# Patient Record
Sex: Female | Born: 1990 | Race: White | Hispanic: No | Marital: Married | Smoking: Never smoker
Health system: Southern US, Community
[De-identification: ages and names within clinical notes are randomized; demographics above are authoritative.]

## PROBLEM LIST (undated history)

## (undated) DIAGNOSIS — Z9889 Other specified postprocedural states: Secondary | ICD-10-CM

## (undated) DIAGNOSIS — R112 Nausea with vomiting, unspecified: Secondary | ICD-10-CM

## (undated) DIAGNOSIS — Z8719 Personal history of other diseases of the digestive system: Secondary | ICD-10-CM

---

## 2017-07-23 DIAGNOSIS — E538 Deficiency of other specified B group vitamins: Secondary | ICD-10-CM | POA: Insufficient documentation

## 2018-11-05 ENCOUNTER — Encounter: Payer: Self-pay | Admitting: *Deleted

## 2018-11-05 DIAGNOSIS — Z3490 Encounter for supervision of normal pregnancy, unspecified, unspecified trimester: Secondary | ICD-10-CM | POA: Insufficient documentation

## 2018-11-13 ENCOUNTER — Other Ambulatory Visit: Payer: Self-pay

## 2018-11-13 ENCOUNTER — Encounter: Payer: Self-pay | Admitting: Advanced Practice Midwife

## 2018-11-13 ENCOUNTER — Ambulatory Visit (INDEPENDENT_AMBULATORY_CARE_PROVIDER_SITE_OTHER): Payer: BC Managed Care – PPO | Admitting: Advanced Practice Midwife

## 2018-11-13 VITALS — BP 110/69 | HR 74 | Ht 62.0 in | Wt 162.0 lb

## 2018-11-13 DIAGNOSIS — O34219 Maternal care for unspecified type scar from previous cesarean delivery: Secondary | ICD-10-CM | POA: Diagnosis not present

## 2018-11-13 DIAGNOSIS — Z8759 Personal history of other complications of pregnancy, childbirth and the puerperium: Secondary | ICD-10-CM | POA: Insufficient documentation

## 2018-11-13 DIAGNOSIS — Z3A10 10 weeks gestation of pregnancy: Secondary | ICD-10-CM

## 2018-11-13 DIAGNOSIS — Z3490 Encounter for supervision of normal pregnancy, unspecified, unspecified trimester: Secondary | ICD-10-CM

## 2018-11-13 MED ORDER — ASPIRIN EC 81 MG PO TBEC: 81.0000 mg | DELAYED_RELEASE_TABLET | Freq: Every day | ORAL | 8 refills | Status: DC

## 2018-11-13 NOTE — Progress Notes (Signed)
Bedside U/S shows single IUP with FHT of 178 BPM and CRL measures 34.52mm  GA is [redacted]w[redacted]d

## 2018-11-13 NOTE — Progress Notes (Signed)
Gestational hypertension in last pregnancy

## 2018-11-13 NOTE — Progress Notes (Signed)
Subjective:   Catherine Wu is a 28 y.o. [id number] at 6110w1d by LMP c/w 10 week ultrasound being seen today for her first obstetrical visit.  Her obstetrical history is significant for pregnancy induced hypertension, previous cesarean section. Patient does intend to breast feed. Pregnancy history fully reviewed.  Patient reports heartburn and occasional nausea that is improving. Denies vomiting, abdominal cramping, vaginal bleeding or abnormal discharge. Pt would like to discuss TOLAC.   HISTORY: OB History  Gravida Para Term Preterm AB Living  5 2 2  0 2 2  SAB TAB Ectopic Multiple Live Births  2 0 0 0 2    # Outcome Date GA Lbr Len/2nd Weight Sex Delivery Anes PTL Lv  5 Current           4 SAB           3 SAB           2 Term      CS-LTranv     1 Term      CS-LTranv      No past medical history on file. Past Surgical History:  Procedure Laterality Date  . CESAREAN SECTION     Family History  Problem Relation Age of Onset  . Kidney disease Maternal Grandfather        dialysis  . Heart disease Maternal Grandfather   . Thyroid disease Paternal Grandmother    Social History   Tobacco Use  . Smoking status: Never Smoker  . Smokeless tobacco: Never Used  Substance Use Topics  . Alcohol use: Never    Frequency: Never  . Drug use: Never   Allergies  Allergen Reactions  . Penicillins    No current outpatient medications on file prior to visit.   No current facility-administered medications on file prior to visit.      Indications for ASA therapy (per uptodate) One of the following: Previous pregnancy with preeclampsia, especially early onset and with an adverse outcome Yes Multifetal gestation No Chronic hypertension No Type 1 or 2 diabetes mellitus No Chronic kidney disease No Autoimmune disease (antiphospholipid syndrome, systemic lupus erythematosus) No  Two or more of the following: Nulliparity No Obesity (body mass index >30 kg/m2) No Family history of  preeclampsia in mother or sister No Age ?35 years No Sociodemographic characteristics (African American race, low socioeconomic level) No Personal risk factors (eg, previous pregnancy with low birth weight or small for gestational age infant, previous adverse pregnancy outcome [eg, stillbirth], interval >10 years between pregnancies) No   Indications for early 1 hour GTT (per uptodate)  BMI >25 (>23 in Asian women) AND one of the following  Gestational diabetes mellitus in a previous pregnancy No Glycated hemoglobin ?5.7 percent (39 mmol/mol), impaired glucose tolerance, or impaired fasting glucose on previous testing No First-degree relative with diabetes No High-risk race/ethnicity (eg, African American, Latino, Native American, [place] American, Pacific Islander) No History of cardiovascular disease No Hypertension or on therapy for hypertension No High-density lipoprotein cholesterol level <35 mg/dL (1.470.90 mmol/L) and/or a triglyceride level >250 mg/dL (8.292.82 mmol/L) No Polycystic ovary syndrome No Physical inactivity No Other clinical condition associated with insulin resistance (eg, severe obesity, acanthosis nigricans) No Previous birth of an infant weighing ?4000 g No Previous stillbirth of unknown cause No   Exam   Vitals:   11/13/18 1521 11/13/18 1523  BP: 110/69   Pulse: 74   Weight: 73.5 kg   Height:  5\' 2"  (1.575 m)   Fetal Heart  Rate (bpm): 178  Uterus:     Pelvic Exam: Perineum: Deferred   Vulva: Deferred   Vagina:  Deferred   Cervix: Deferred; pap smear not obtained    Adnexa: Deferred   Bony Pelvis: average  System: General: well-developed, well-nourished female in no acute distress   Breast:  normal appearance, no masses or tenderness   Skin: normal coloration and turgor, no rashes   Neurologic: oriented, normal, negative, normal mood   Extremities: normal strength, tone, and muscle mass, ROM of all joints is normal   HEENT PERRLA, extraocular movement  intact and sclera clear, anicteric   Mouth/Teeth mucous membranes moist, pharynx normal without lesions and dental hygiene good   Neck supple and no masses   Cardiovascular: regular rate and rhythm   Respiratory:  no respiratory distress, normal breath sounds   Abdomen: soft, non-tender; bowel sounds normal; no masses,  no organomegaly     Assessment:   Pregnancy: [id number] Patient Active Problem List   Diagnosis Date Noted  . Supervision of normal pregnancy, antepartum 11/05/2018     Plan:  1. Encounter for supervision of normal pregnancy, antepartum, unspecified gravidity - Pt would like information on TOLAC/VBAC. Discussed with patient and the need to talk with MD for consent. Information given to patient.  - Anticipatory guidance provided for upcoming appointments  - Obstetric panel - HIV antibody (with reflex) - Culture, OB Urine - Babyscripts Schedule Optimization - [place] bedside; Future - Comprehensive metabolic panel - Protein / creatinine ratio, urine   2. History of gestational hypertension - Pt with history of pre-e and gHTN - Start ASA at 12 weeks  - aspirin EC 81 MG tablet; Take 1 tablet (81 mg total) by mouth daily.  Dispense: 30 tablet; Refill: 8   Initial labs drawn. Start on ASA at [redacted] weeks gestation Continue prenatal vitamins. Genetic Screening discussed, NIPS: undecided. Would like to contact insurance company and discuss cost  Problem list reviewed and updated The nature of [place] for [employer] with multiple MDs and other [place] Providers was explained to patient; also emphasized that fellows, residents, and students are part of our team. Routine obstetric precautions reviewed No follow-ups on file.  Follow up in 3 weeks for ROB and NIPS or as needed   Renee Harder, SNM 5:05 PM 11/13/18    CNM attestation:  I have seen and examined this patient; I agree with above documentation in the midwife student's note.    Catherine Wu is a 28 y.o. [id number] in the [place] office for routine prenatal visit for low risk pregnancy. See problem list below. Denies LOF, VB, contractions, vaginal discharge.  Patient Active Problem List   Diagnosis Date Noted  . Supervision of normal pregnancy, antepartum 11/05/2018     ROS, labs, PMH reviewed  PE: BP 110/69   Pulse 74   Ht 5\' 2"  (1.575 m)   Wt 162 lb (73.5 kg)   LMP 09/03/2018   BMI 29.63 kg/m  Gen: calm comfortable, well appearing Resp: normal effort, no distress Abd: gravid appropriate for gestational age  Bedside US reveals IUP with normal FHR with dates d/w LMP dating  Assessment/Plan:  1. Encounter for supervision of normal pregnancy, antepartum, unspecified gravidity --Anticipatory guidance about next visits/weeks of pregnancy given. --Pt considering NIPS, will check with insurance about coverage - Obstetric panel - HIV antibody (with reflex) - Culture, OB Urine - Babyscripts Schedule Optimization - [place] bedside; Future - Comprehensive metabolic panel - Protein /  creatinine ratio, urine - Cytology - PAP( [place])  2. History of gestational hypertension --PEC with first pregnancy, some mildly elevated BP that resolved with hospital admission last pregnancy.  Pt thinks she was anxious because of the first pregnancy causing her BP to be high. - aspirin EC 81 MG tablet; Take 1 tablet (81 mg total) by mouth daily.  Dispense: 30 tablet; Refill: 8   3. Hx of cesarean section complicating pregnancy --Interest in TOLAC, does not think she was given a chance with [place] in [place].   --Discussed increased risks with 2 prior C/S, need for labor to begin on its own, no IOL.  MD to consent pt but printed materials given today for she and her husband to review.  Sharen CounterLisa Leftwich-Kirby, CNM 5:39 PM

## 2018-11-14 LAB — COMPREHENSIVE METABOLIC PANEL
AG Ratio: 1.6 (calc) (ref 1.0–2.5)
ALT: 8 U/L (ref 6–29)
AST: 12 U/L (ref 10–30)
Albumin: 4.2 g/dL (ref 3.6–5.1)
Alkaline phosphatase (APISO): 53 U/L (ref 31–125)
BUN: 7 mg/dL (ref 7–25)
CO2: 25 mmol/L (ref 20–32)
Calcium: 10.2 mg/dL (ref 8.6–10.2)
Chloride: 103 mmol/L (ref 98–110)
Creat: 0.62 mg/dL (ref 0.50–1.10)
Globulin: 2.7 g/dL (calc) (ref 1.9–3.7)
Glucose, Bld: 88 mg/dL (ref 65–99)
Potassium: 4 mmol/L (ref 3.5–5.3)
Sodium: 139 mmol/L (ref 135–146)
Total Bilirubin: 0.6 mg/dL (ref 0.2–1.2)
Total Protein: 6.9 g/dL (ref 6.1–8.1)

## 2018-11-14 LAB — OBSTETRIC PANEL
Absolute Monocytes: 476 cells/uL (ref 200–950)
Antibody Screen: NOT DETECTED
Basophils Absolute: 9 cells/uL (ref 0–200)
Basophils Relative: 0.1 %
Eosinophils Absolute: 43 cells/uL (ref 15–500)
Eosinophils Relative: 0.5 %
HCT: 37.4 % (ref 35.0–45.0)
Hemoglobin: 12.5 g/dL (ref 11.7–15.5)
Hepatitis B Surface Ag: NONREACTIVE
Lymphs Abs: 2227 cells/uL (ref 850–3900)
MCH: 30.9 pg (ref 27.0–33.0)
MCHC: 33.4 g/dL (ref 32.0–36.0)
MCV: 92.3 fL (ref 80.0–100.0)
MPV: 11.5 fL (ref 7.5–12.5)
Monocytes Relative: 5.6 %
Neutro Abs: 5746 cells/uL (ref 1500–7800)
Neutrophils Relative %: 67.6 %
Platelets: 242 10*3/uL (ref 140–400)
RBC: 4.05 10*6/uL (ref 3.80–5.10)
RDW: 13.3 % (ref 11.0–15.0)
RPR Ser Ql: NONREACTIVE
Rubella: 1.23 index
Total Lymphocyte: 26.2 %
WBC: 8.5 10*3/uL (ref 3.8–10.8)

## 2018-11-14 LAB — PROTEIN / CREATININE RATIO, URINE
Creatinine, Urine: 17 mg/dL — ABNORMAL LOW (ref 20–275)
Total Protein, Urine: <4 mg/dL — ABNORMAL LOW (ref 5–24)

## 2018-11-14 LAB — HIV ANTIBODY (ROUTINE TESTING W REFLEX): HIV 1&2 Ab, 4th Generation: NONREACTIVE

## 2018-11-15 LAB — CULTURE, OB URINE

## 2018-11-15 LAB — URINE CULTURE, OB REFLEX

## 2018-12-04 ENCOUNTER — Ambulatory Visit (INDEPENDENT_AMBULATORY_CARE_PROVIDER_SITE_OTHER): Payer: BC Managed Care – PPO | Admitting: Advanced Practice Midwife

## 2018-12-04 ENCOUNTER — Other Ambulatory Visit: Payer: Self-pay

## 2018-12-04 VITALS — BP 119/78 | HR 89 | Wt 162.0 lb

## 2018-12-04 DIAGNOSIS — O09292 Supervision of pregnancy with other poor reproductive or obstetric history, second trimester: Secondary | ICD-10-CM

## 2018-12-04 DIAGNOSIS — Z3480 Encounter for supervision of other normal pregnancy, unspecified trimester: Secondary | ICD-10-CM

## 2018-12-04 DIAGNOSIS — Z3A19 19 weeks gestation of pregnancy: Secondary | ICD-10-CM

## 2018-12-04 DIAGNOSIS — Z8759 Personal history of other complications of pregnancy, childbirth and the puerperium: Secondary | ICD-10-CM

## 2018-12-04 NOTE — Progress Notes (Signed)
   PRENATAL VISIT NOTE  Subjective:  Catherine Wu is a 28 y.o. [id number] at [redacted]w[redacted]d being seen today for ongoing prenatal care.  She is currently monitored for the following issues for this low-risk pregnancy and has Supervision of normal pregnancy, antepartum; Hx of cesarean section complicating pregnancy; and History of gestational hypertension on their problem list.  Patient reports no complaints.  Contractions: Not present. Vag. Bleeding: None.  Movement: Absent. Denies leaking of fluid.   The following portions of the patient's history were reviewed and updated as appropriate: allergies, current medications, past family history, past medical history, past social history, past surgical history and problem list.   Objective:   Vitals:   12/04/18 1341  BP: 119/78  Pulse: 89  Weight: 73.5 kg    Fetal Status: Fetal Heart Rate (bpm): 162   Movement: Absent     General:  Alert, oriented and cooperative. Patient is in no acute distress.  Skin: Skin is warm and dry. No rash noted.   Cardiovascular: Normal heart rate noted  Respiratory: Normal respiratory effort, no problems with respiration noted  Abdomen: Soft, gravid, appropriate for gestational age.  Pain/Pressure: Absent     Pelvic: Cervical exam deferred        Extremities: Normal range of motion.  Edema: None  Mental Status: Normal mood and affect. Normal behavior. Normal judgment and thought content.   Assessment and Plan:  Pregnancy: [id number] at [redacted]w[redacted]d  1. Supervision of other normal pregnancy, antepartum - Pt is doing well. No complaints today - Pt interested in NIPS. Needs approval from insurance company. Pt contacted insurance company while here in the office and was told that everything should be sent to [place] in about 11 days. - Once approved, pt to come in for lab visit - Pt interested in [place]. Was given paper on risks/benefits of TOLAC. Will need MD consent during pregnancy - Anticipatory guidance for upcoming  appointments  - Genetic Screening  2. History of gestational hypertension - Pt started on BASA at 7 weeks - Educated on use of BASA until 6 week PP with those at risk for pre-eclampsia   3. [redacted] weeks gestation of pregnancy - Will schedule anatomy scan today  - [place] MFM OB COMP + 14 WK; Future   Preterm labor symptoms and general obstetric precautions including but not limited to vaginal bleeding, contractions, leaking of fluid and fetal movement were reviewed in detail with the patient. Please refer to After Visit Summary for other counseling recommendations.   No follow-ups on file.  No future appointments.  Follow up in 6 weeks for ROB  Maryagnes Amos, SNM

## 2018-12-04 NOTE — Patient Instructions (Signed)

## 2018-12-14 ENCOUNTER — Encounter: Payer: Self-pay | Admitting: *Deleted

## 2018-12-14 DIAGNOSIS — Z3480 Encounter for supervision of other normal pregnancy, unspecified trimester: Secondary | ICD-10-CM

## 2019-01-16 ENCOUNTER — Ambulatory Visit (HOSPITAL_COMMUNITY): Payer: BC Managed Care – PPO

## 2019-01-22 ENCOUNTER — Ambulatory Visit (HOSPITAL_COMMUNITY)
Admission: RE | Admit: 2019-01-22 | Discharge: 2019-01-22 | Disposition: A | Discharge status: Home / Self Care | Payer: BC Managed Care – PPO | Source: Ambulatory Visit | Attending: Advanced Practice Midwife | Admitting: Advanced Practice Midwife

## 2019-01-22 ENCOUNTER — Other Ambulatory Visit: Payer: Self-pay

## 2019-01-22 ENCOUNTER — Other Ambulatory Visit: Payer: Self-pay | Admitting: Advanced Practice Midwife

## 2019-01-22 ENCOUNTER — Telehealth: Payer: BC Managed Care – PPO | Admitting: Obstetrics and Gynecology

## 2019-01-22 DIAGNOSIS — Z3A19 19 weeks gestation of pregnancy: Secondary | ICD-10-CM

## 2019-01-22 DIAGNOSIS — O09299 Supervision of pregnancy with other poor reproductive or obstetric history, unspecified trimester: Secondary | ICD-10-CM

## 2019-01-22 DIAGNOSIS — O34219 Maternal care for unspecified type scar from previous cesarean delivery: Secondary | ICD-10-CM

## 2019-01-22 DIAGNOSIS — Z3A20 20 weeks gestation of pregnancy: Secondary | ICD-10-CM | POA: Diagnosis not present

## 2019-01-25 ENCOUNTER — Telehealth (INDEPENDENT_AMBULATORY_CARE_PROVIDER_SITE_OTHER): Payer: BC Managed Care – PPO | Admitting: Obstetrics and Gynecology

## 2019-01-25 DIAGNOSIS — Z3482 Encounter for supervision of other normal pregnancy, second trimester: Secondary | ICD-10-CM

## 2019-01-25 DIAGNOSIS — Z3A20 20 weeks gestation of pregnancy: Secondary | ICD-10-CM | POA: Diagnosis not present

## 2019-01-25 DIAGNOSIS — Z3480 Encounter for supervision of other normal pregnancy, unspecified trimester: Secondary | ICD-10-CM

## 2019-01-25 NOTE — Progress Notes (Signed)
Pt states she will go buy BP cuff and start entering BP into Babyscripts

## 2019-01-25 NOTE — Progress Notes (Signed)
   TELEHEALTH VIRTUAL OBSTETRICS VISIT ENCOUNTER NOTE  I connected with Catherine Wu on 01/25/19 at 10:15 AM EDT by telephone at home and verified that I am speaking with the correct person using two identifiers.   I discussed the limitations, risks, security and privacy concerns of performing an evaluation and management service by telephone and the availability of in person appointments. I also discussed with the patient that there may be a patient responsible charge related to this service. The patient expressed understanding and agreed to proceed.  Subjective:  Catherine Wu is a 28 y.o. [phone] at [redacted]w[redacted]d being followed for ongoing prenatal care.  She is currently monitored for the following issues for this low-risk pregnancy and has Supervision of normal pregnancy, antepartum; Hx of cesarean section complicating pregnancy; and History of gestational hypertension on their problem list.  Patient reports no complaints. Reports fetal movement. Denies any contractions, bleeding or leaking of fluid.   The following portions of the patient's history were reviewed and updated as appropriate: allergies, current medications, past family history, past medical history, past social history, past surgical history and problem list.   Objective:   General:  Alert, oriented and cooperative.   Mental Status: Normal mood and affect perceived. Normal judgment and thought content.  Rest of physical exam deferred due to type of encounter  Assessment and Plan:  Pregnancy: [id number] at [redacted]w[redacted]d 1. Supervision of other normal pregnancy, antepartum  Does not have BP cuff. Plans to get one today. Discussed importance of checking BP. Continue BASA. Next visit with MD to discuss risk of TOLAC after 2 cesarean sections.   Preterm labor symptoms and general obstetric precautions including but not limited to vaginal bleeding, contractions, leaking of fluid and fetal movement were reviewed in detail with the patient.  I  discussed the assessment and treatment plan with the patient. The patient was provided an opportunity to ask questions and all were answered. The patient agreed with the plan and demonstrated an understanding of the instructions. The patient was advised to call back or seek an in-person office evaluation/go to [facility] at [facility] for any urgent or concerning symptoms. Please refer to After Visit Summary for other counseling recommendations.   I provided 11 minutes of non-face-to-face time during this encounter.  No follow-ups on file.  No future appointments.  Noni Saupe, NP [facility] [employer] [facility], [place]

## 2019-03-27 ENCOUNTER — Telehealth: Payer: Self-pay | Admitting: *Deleted

## 2019-03-27 NOTE — Telephone Encounter (Signed)
Left patient a message to call and schedule OB/GTT appointment for Friday, 04/05/2019 at 9:00 AM as of this time today.

## 2019-04-05 ENCOUNTER — Other Ambulatory Visit: Payer: Self-pay

## 2019-04-05 ENCOUNTER — Ambulatory Visit (INDEPENDENT_AMBULATORY_CARE_PROVIDER_SITE_OTHER): Payer: BC Managed Care – PPO | Admitting: Certified Nurse Midwife

## 2019-04-05 VITALS — BP 128/76 | HR 105 | Temp 98.4°F | Wt 186.0 lb

## 2019-04-05 DIAGNOSIS — Z3480 Encounter for supervision of other normal pregnancy, unspecified trimester: Secondary | ICD-10-CM

## 2019-04-05 DIAGNOSIS — Z3A28 28 weeks gestation of pregnancy: Secondary | ICD-10-CM

## 2019-04-05 DIAGNOSIS — Z8759 Personal history of other complications of pregnancy, childbirth and the puerperium: Secondary | ICD-10-CM

## 2019-04-05 DIAGNOSIS — O34219 Maternal care for unspecified type scar from previous cesarean delivery: Secondary | ICD-10-CM

## 2019-04-05 NOTE — Progress Notes (Signed)
Subjective:  Catherine Wu is a 29 y.o. [phone] at [redacted]w[redacted]d being seen today for ongoing prenatal care.  She is currently monitored for the following issues for this low-risk pregnancy and has Supervision of normal pregnancy, antepartum; Hx of cesarean section complicating pregnancy; and History of gestational hypertension on their problem list.  Patient reports no complaints.  Contractions: Not present. Vag. Bleeding: None.  Movement: Present. Denies leaking of fluid.   The following portions of the patient's history were reviewed and updated as appropriate: allergies, current medications, past family history, past medical history, past social history, past surgical history and problem list. Problem list updated.  Objective:   Vitals:   04/05/19 0840  BP: 128/76  Pulse: (!) 105  Temp: 98.4 F (36.9 C)  Weight: 186 lb (84.4 kg)    Fetal Status: Fetal Heart Rate (bpm): 148 Fundal Height: 32 cm Movement: Present  Presentation: Vertex  General:  Alert, oriented and cooperative. Patient is in no acute distress.  Skin: Skin is warm and dry. No rash noted.   Cardiovascular: Normal heart rate noted  Respiratory: Normal respiratory effort, no problems with respiration noted  Abdomen: Soft, gravid, appropriate for gestational age. Pain/Pressure: Absent     Pelvic: Vag. Bleeding: None Vag D/C Character: Thin   Cervical exam deferred        Extremities: Normal range of motion.  Edema: None  Mental Status: Normal mood and affect. Normal behavior. Normal judgment and thought content.   Urinalysis:      Assessment and Plan:  Pregnancy: [id number] at [redacted]w[redacted]d  1. Supervision of other normal pregnancy, antepartum - 2 Hour GTT - HIV antibody (with reflex) - CBC - RPR - had lapse in care from 20-30 wks, was going to change practices d/t insurance but decided to stay with [facility]  2. Hx of cesarean section complicating pregnancy - desires TOLAC after 2 prev CS - hx of failed IOL for PEC then RCS for gHTN  (delivered in [place]) - will need MD consult visit- schedule next - informed may allow spontaneous labor but no IOL or augmentation  3. History of gestational hypertension - continue bASA - home BP monitoring  Preterm labor symptoms and general obstetric precautions including but not limited to vaginal bleeding, contractions, leaking of fluid and fetal movement were reviewed in detail with the patient. Please refer to After Visit Summary for other counseling recommendations.  Return in about 2 weeks (around 04/19/2019).   Donette Larry, CNM

## 2019-04-05 NOTE — Patient Instructions (Signed)
Third Trimester of Pregnancy The third trimester is from week 28 through week 40 (months 7 through 9). The third trimester is a time when the unborn baby (fetus) is growing rapidly. At the end of the ninth month, the fetus is about 20 inches in length and weighs 6-10 pounds. Body changes during your third trimester Your body will continue to go through many changes during pregnancy. The changes vary from woman to woman. During the third trimester:  Your weight will continue to increase. You can expect to gain 25-35 pounds (11-16 kg) by the end of the pregnancy.  You may begin to get stretch marks on your hips, abdomen, and breasts.  You may urinate more often because the fetus is moving lower into your pelvis and pressing on your bladder.  You may develop or continue to have heartburn. This is caused by increased hormones that slow down muscles in the digestive tract.  You may develop or continue to have constipation because increased hormones slow digestion and cause the muscles that push waste through your intestines to relax.  You may develop hemorrhoids. These are swollen veins (varicose veins) in the rectum that can itch or be painful.  You may develop swollen, bulging veins (varicose veins) in your legs.  You may have increased body aches in the pelvis, back, or thighs. This is due to weight gain and increased hormones that are relaxing your joints.  You may have changes in your hair. These can include thickening of your hair, rapid growth, and changes in texture. Some women also have hair loss during or after pregnancy, or hair that feels dry or thin. Your hair will most likely return to normal after your baby is born.  Your breasts will continue to grow and they will continue to become tender. A yellow fluid (colostrum) may leak from your breasts. This is the first milk you are producing for your baby.  Your belly button may stick out.  You may notice more swelling in your hands,  face, or ankles.  You may have increased tingling or numbness in your hands, arms, and legs. The skin on your belly may also feel numb.  You may feel short of breath because of your expanding uterus.  You may have more problems sleeping. This can be caused by the size of your belly, increased need to urinate, and an increase in your body's metabolism.  You may notice the fetus "dropping," or moving lower in your abdomen (lightening).  You may have increased vaginal discharge.  You may notice your joints feel loose and you may have pain around your pelvic bone. What to expect at prenatal visits You will have prenatal exams every 2 weeks until week 36. Then you will have weekly prenatal exams. During a routine prenatal visit:  You will be weighed to make sure you and the baby are growing normally.  Your blood pressure will be taken.  Your abdomen will be measured to track your baby's growth.  The fetal heartbeat will be listened to.  Any test results from the previous visit will be discussed.  You may have a cervical check near your due date to see if your cervix has softened or thinned (effaced).  You will be tested for Group B streptococcus. This happens between 35 and 37 weeks. Your health care provider may ask you:  What your birth plan is.  How you are feeling.  If you are feeling the baby move.  If you have had any abnormal   symptoms, such as leaking fluid, bleeding, severe headaches, or abdominal cramping.  If you are using any tobacco products, including cigarettes, chewing tobacco, and electronic cigarettes.  If you have any questions. Other tests or screenings that may be performed during your third trimester include:  Blood tests that check for low iron levels (anemia).  Fetal testing to check the health, activity level, and growth of the fetus. Testing is done if you have certain medical conditions or if there are problems during the pregnancy.  Nonstress test  (NST). This test checks the health of your baby to make sure there are no signs of problems, such as the baby not getting enough oxygen. During this test, a belt is placed around your belly. The baby is made to move, and its heart rate is monitored during movement. What is false labor? False labor is a condition in which you feel small, irregular tightenings of the muscles in the womb (contractions) that usually go away with rest, changing position, or drinking water. These are called Braxton Hicks contractions. Contractions may last for hours, days, or even weeks before true labor sets in. If contractions come at regular intervals, become more frequent, increase in intensity, or become painful, you should see your health care provider. What are the signs of labor?  Abdominal cramps.  Regular contractions that start at 10 minutes apart and become stronger and more frequent with time.  Contractions that start on the top of the uterus and spread down to the lower abdomen and back.  Increased pelvic pressure and dull back pain.  A watery or bloody mucus discharge that comes from the vagina.  Leaking of amniotic fluid. This is also known as your "water breaking." It could be a slow trickle or a gush. Let your health care provider know if it has a color or strange odor. If you have any of these signs, call your health care provider right away, even if it is before your due date. Follow these instructions at home: Medicines  Follow your health care provider's instructions regarding medicine use. Specific medicines may be either safe or unsafe to take during pregnancy.  Take a prenatal vitamin that contains at least 600 micrograms (mcg) of folic acid.  If you develop constipation, try taking a stool softener if your health care provider approves. Eating and drinking   Eat a balanced diet that includes fresh fruits and vegetables, whole grains, good sources of protein such as meat, eggs, or tofu,  and low-fat dairy. Your health care provider will help you determine the amount of weight gain that is right for you.  Avoid raw meat and uncooked cheese. These carry germs that can cause birth defects in the baby.  If you have low calcium intake from food, talk to your health care provider about whether you should take a daily calcium supplement.  Eat four or five small meals rather than three large meals a day.  Limit foods that are high in fat and processed sugars, such as fried and sweet foods.  To prevent constipation: ? Drink enough fluid to keep your urine clear or pale yellow. ? Eat foods that are high in fiber, such as fresh fruits and vegetables, whole grains, and beans. Activity  Exercise only as directed by your health care provider. Most women can continue their usual exercise routine during pregnancy. Try to exercise for 30 minutes at least 5 days a week. Stop exercising if you experience uterine contractions.  Avoid heavy lifting.  Do   not exercise in extreme heat or humidity, or at high altitudes.  Wear low-heel, comfortable shoes.  Practice good posture.  You may continue to have sex unless your health care provider tells you otherwise. Relieving pain and discomfort  Take frequent breaks and rest with your legs elevated if you have leg cramps or low back pain.  Take warm sitz baths to soothe any pain or discomfort caused by hemorrhoids. Use hemorrhoid cream if your health care provider approves.  Wear a good support bra to prevent discomfort from breast tenderness.  If you develop varicose veins: ? Wear support pantyhose or compression stockings as told by your healthcare provider. ? Elevate your feet for 15 minutes, 3-4 times a day. Prenatal care  Write down your questions. Take them to your prenatal visits.  Keep all your prenatal visits as told by your health care provider. This is important. Safety  Wear your seat belt at all times when driving.  Make  a list of emergency phone numbers, including numbers for family, friends, the hospital, and police and fire departments. General instructions  Avoid cat litter boxes and soil used by cats. These carry germs that can cause birth defects in the baby. If you have a cat, ask someone to clean the litter box for you.  Do not travel far distances unless it is absolutely necessary and only with the approval of your health care provider.  Do not use hot tubs, steam rooms, or saunas.  Do not drink alcohol.  Do not use any products that contain nicotine or tobacco, such as cigarettes and e-cigarettes. If you need help quitting, ask your health care provider.  Do not use any medicinal herbs or unprescribed drugs. These chemicals affect the formation and growth of the baby.  Do not douche or use tampons or scented sanitary pads.  Do not cross your legs for long periods of time.  To prepare for the arrival of your baby: ? Take prenatal classes to understand, practice, and ask questions about labor and delivery. ? Make a trial run to the hospital. ? Visit the hospital and tour the maternity area. ? Arrange for maternity or paternity leave through employers. ? Arrange for family and friends to take care of pets while you are in the hospital. ? Purchase a rear-facing car seat and make sure you know how to install it in your car. ? Pack your hospital bag. ? Prepare the baby's nursery. Make sure to remove all pillows and stuffed animals from the baby's crib to prevent suffocation.  Visit your dentist if you have not gone during your pregnancy. Use a soft toothbrush to brush your teeth and be gentle when you floss. Contact a health care provider if:  You are unsure if you are in labor or if your water has broken.  You become dizzy.  You have mild pelvic cramps, pelvic pressure, or nagging pain in your abdominal area.  You have lower back pain.  You have persistent nausea, vomiting, or  diarrhea.  You have an unusual or bad smelling vaginal discharge.  You have pain when you urinate. Get help right away if:  Your water breaks before 37 weeks.  You have regular contractions less than 5 minutes apart before 37 weeks.  You have a fever.  You are leaking fluid from your vagina.  You have spotting or bleeding from your vagina.  You have severe abdominal pain or cramping.  You have rapid weight loss or weight gain.  You have   shortness of breath with chest pain.  You notice sudden or extreme swelling of your face, hands, ankles, feet, or legs.  Your baby makes fewer than 10 movements in 2 hours.  You have severe headaches that do not go away when you take medicine.  You have vision changes. Summary  The third trimester is from week 28 through week 40, months 7 through 9. The third trimester is a time when the unborn baby (fetus) is growing rapidly.  During the third trimester, your discomfort may increase as you and your baby continue to gain weight. You may have abdominal, leg, and back pain, sleeping problems, and an increased need to urinate.  During the third trimester your breasts will keep growing and they will continue to become tender. A yellow fluid (colostrum) may leak from your breasts. This is the first milk you are producing for your baby.  False labor is a condition in which you feel small, irregular tightenings of the muscles in the womb (contractions) that eventually go away. These are called Braxton Hicks contractions. Contractions may last for hours, days, or even weeks before true labor sets in.  Signs of labor can include: abdominal cramps; regular contractions that start at 10 minutes apart and become stronger and more frequent with time; watery or bloody mucus discharge that comes from the vagina; increased pelvic pressure and dull back pain; and leaking of amniotic fluid. This information is not intended to replace advice given to you by your  health care provider. Make sure you discuss any questions you have with your health care provider. Document Revised: 07/05/2018 Document Reviewed: 04/19/2016 Elsevier Patient Education  2020 Elsevier Inc.  

## 2019-04-08 ENCOUNTER — Telehealth: Payer: Self-pay | Admitting: *Deleted

## 2019-04-08 ENCOUNTER — Encounter: Payer: Self-pay | Admitting: Certified Nurse Midwife

## 2019-04-08 DIAGNOSIS — O99019 Anemia complicating pregnancy, unspecified trimester: Secondary | ICD-10-CM | POA: Insufficient documentation

## 2019-04-08 LAB — CBC
HCT: 30.3 % — ABNORMAL LOW (ref 35.0–45.0)
Hemoglobin: 9.9 g/dL — ABNORMAL LOW (ref 11.7–15.5)
MCH: 29.1 pg (ref 27.0–33.0)
MCHC: 32.7 g/dL (ref 32.0–36.0)
MCV: 89.1 fL (ref 80.0–100.0)
MPV: 10.8 fL (ref 7.5–12.5)
Platelets: 273 10*3/uL (ref 140–400)
RBC: 3.4 10*6/uL — ABNORMAL LOW (ref 3.80–5.10)
RDW: 13.3 % (ref 11.0–15.0)
WBC: 9.5 10*3/uL (ref 3.8–10.8)

## 2019-04-08 LAB — 2HR GTT W 1 HR, CARPENTER, 75 G
Glucose, 1 Hr, Gest: 99 mg/dL (ref 65–179)
Glucose, 2 Hr, Gest: 129 mg/dL (ref 65–152)
Glucose, Fasting, Gest: 90 mg/dL (ref 65–91)

## 2019-04-08 LAB — HIV ANTIBODY (ROUTINE TESTING W REFLEX): HIV 1&2 Ab, 4th Generation: NONREACTIVE

## 2019-04-08 LAB — RPR: RPR Ser Ql: NONREACTIVE

## 2019-04-08 MED ORDER — POLYSACCHARIDE IRON COMPLEX 150 MG PO CAPS: 150.0000 mg | ORAL_CAPSULE | Freq: Every day | ORAL | 6 refills | Status: DC

## 2019-04-08 NOTE — Telephone Encounter (Signed)
Pt notified of recent labwork and that she is a bit anemic.  Per Jesse Fall a RX for Estelle June was sent to [facility] in [place].

## 2019-04-08 NOTE — Telephone Encounter (Signed)
-----   Message from Donette Larry, [place] sent at 04/08/2019 10:45 AM EST ----- Anemia, please send Ferrex 150 mg daily. Thanks

## 2019-04-18 ENCOUNTER — Encounter: Payer: Self-pay | Admitting: Obstetrics & Gynecology

## 2019-04-18 ENCOUNTER — Telehealth (INDEPENDENT_AMBULATORY_CARE_PROVIDER_SITE_OTHER): Payer: BC Managed Care – PPO | Admitting: Obstetrics & Gynecology

## 2019-04-18 DIAGNOSIS — O99019 Anemia complicating pregnancy, unspecified trimester: Secondary | ICD-10-CM

## 2019-04-18 DIAGNOSIS — Z3480 Encounter for supervision of other normal pregnancy, unspecified trimester: Secondary | ICD-10-CM

## 2019-04-18 DIAGNOSIS — O99013 Anemia complicating pregnancy, third trimester: Secondary | ICD-10-CM

## 2019-04-18 DIAGNOSIS — Z3A32 32 weeks gestation of pregnancy: Secondary | ICD-10-CM

## 2019-04-18 NOTE — Progress Notes (Signed)
   TELEHEALTH VIRTUAL OBSTETRICS VISIT ENCOUNTER NOTE  I connected with Catherine Wu on 04/18/19 at  3:15 PM EST by telephone at home and verified that I am speaking with the correct person using two identifiers.   I discussed the limitations, risks, security and privacy concerns of performing an evaluation and management service by telephone and the availability of in person appointments. I also discussed with the patient that there may be a patient responsible charge related to this service. The patient expressed understanding and agreed to proceed.  Subjective:  Catherine Wu is a 29 y.o. [phone] at [redacted]w[redacted]d being followed for ongoing prenatal care.  She is currently monitored for the following issues for this low-risk pregnancy and has Supervision of normal pregnancy, antepartum; Hx of cesarean section complicating pregnancy; History of gestational hypertension; and Anemia in pregnancy on their problem list.  Patient reports no complaints. Reports fetal movement. Denies any contractions, bleeding or leaking of fluid.   The following portions of the patient's history were reviewed and updated as appropriate: allergies, current medications, past family history, past medical history, past social history, past surgical history and problem list.   Objective:   General:  Alert, oriented and cooperative.   Mental Status: Normal mood and affect perceived. Normal judgment and thought content.  Rest of physical exam deferred due to type of encounter  Assessment and Plan:  Pregnancy: [id number] at [redacted]w[redacted]d 1. Antepartum anemia last hgb 9.9.  Continue iron Check cbc at 36 weeks  2. Supervision of other normal pregnancy, antepartum Needs pap smear--wants to do at 36 weeks BP's normal (needs to take more often) BP today 134/83 (but just walked upstairs). Wants to VBAC.  Understands that we will not induce.  Will sign consent at 36 weeks.    Preterm labor symptoms and general obstetric precautions  including but not limited to vaginal bleeding, contractions, leaking of fluid and fetal movement were reviewed in detail with the patient.  I discussed the assessment and treatment plan with the patient. The patient was provided an opportunity to ask questions and all were answered. The patient agreed with the plan and demonstrated an understanding of the instructions. The patient was advised to call back or seek an in-person office evaluation/go to MAU at [facility] for any urgent or concerning symptoms. Please refer to After Visit Summary for other counseling recommendations.   I provided 15 minutes of non-face-to-face time during this encounter.  With weekly BP at home, next in person will be at 36 weeks.    Future Appointments  Date Time Provider Department Center  04/18/2019  3:15 PM Lesly Dukes, MD [facility]    Elsie Lincoln, MD Center for [facility], [facility]

## 2019-05-15 ENCOUNTER — Other Ambulatory Visit: Payer: Self-pay

## 2019-05-15 ENCOUNTER — Ambulatory Visit (INDEPENDENT_AMBULATORY_CARE_PROVIDER_SITE_OTHER): Payer: BC Managed Care – PPO | Admitting: Nurse Practitioner

## 2019-05-15 VITALS — BP 133/89 | HR 109 | Temp 98.6°F | Wt 199.0 lb

## 2019-05-15 DIAGNOSIS — O34219 Maternal care for unspecified type scar from previous cesarean delivery: Secondary | ICD-10-CM | POA: Diagnosis not present

## 2019-05-15 DIAGNOSIS — Z124 Encounter for screening for malignant neoplasm of cervix: Secondary | ICD-10-CM

## 2019-05-15 DIAGNOSIS — Z3480 Encounter for supervision of other normal pregnancy, unspecified trimester: Secondary | ICD-10-CM

## 2019-05-15 DIAGNOSIS — Z3A36 36 weeks gestation of pregnancy: Secondary | ICD-10-CM | POA: Diagnosis not present

## 2019-05-15 DIAGNOSIS — Z113 Encounter for screening for infections with a predominantly sexual mode of transmission: Secondary | ICD-10-CM

## 2019-05-15 NOTE — Progress Notes (Signed)
BP recheck after visit 119/76 P-97

## 2019-05-15 NOTE — Progress Notes (Signed)
    Subjective:  Catherine Wu is a 29 y.o. [phone] at [redacted]w[redacted]d being seen today for ongoing prenatal care.  She is currently monitored for the following issues for this low-risk pregnancy and has Supervision of normal pregnancy, antepartum; Hx of cesarean section complicating pregnancy; History of gestational hypertension; and Anemia in pregnancy on their problem list.  Patient reports no complaints.  Contractions: Not present. Vag. Bleeding: None.  Movement: Present. Denies leaking of fluid.   The following portions of the patient's history were reviewed and updated as appropriate: allergies, current medications, past family history, past medical history, past social history, past surgical history and problem list. Problem list updated.  Objective:   Vitals:   05/15/19 1456  BP: 133/89  Pulse: (!) 109  Temp: 98.6 F (37 C)  Weight: 199 lb (90.3 kg)    Fetal Status: Fetal Heart Rate (bpm): 146 Fundal Height: 37 cm Movement: Present  Presentation: Vertex  General:  Alert, oriented and cooperative. Patient is in no acute distress.  Skin: Skin is warm and dry. No rash noted.   Cardiovascular: Normal heart rate noted  Respiratory: Normal respiratory effort, no problems with respiration noted  Abdomen: Soft, gravid, appropriate for gestational age. Pain/Pressure: Present     Pelvic:  Cervical exam performed GC/Chlam done and GBS done Dilation: Closed   Station: Ballotable  Extremities: Normal range of motion.  Edema: None  Mental Status: Normal mood and affect. Normal behavior. Normal judgment and thought content.   Urinalysis:      Assessment and Plan:  Pregnancy: [id number] at [redacted]w[redacted]d  1. Supervision of other normal pregnancy, antepartum Reviewed plans for birth - discussed upright positioning for labor and birth Will see MD at next visit to sign VBAC consent Informal [place] by RN confirms vertex presentation as it is very high and not in pelvis BP today higher than usual but less than  140/90.  Instructed to check BP tomorrow and enter into babyscripts.  Other BPs in babyscripts are normal.  No edema today.  No headaches.  - Culture, Grp B Strep w/Rflx Suscept - Cytology - PAP( [facility]) - CBC  2. Hx of cesarean section complicating pregnancy 2 previous C/S - has not labored Understands induction will not be done but wants VBAC if at all possible. Desires midwife for birth  Term labor symptoms and general obstetric precautions including but not limited to vaginal bleeding, contractions, leaking of fluid and fetal movement were reviewed in detail with the patient. Please refer to After Visit Summary for other counseling recommendations.  Return in about 1 week (around 05/22/2019) for in person ROB with MD to sign consent for VBAC.  Nolene Bernheim, RN, MSN, NP-BC Nurse Practitioner, [facility] for [employer], [facility] 05/15/2019 4:34 PM

## 2019-05-17 ENCOUNTER — Encounter: Payer: BC Managed Care – PPO | Admitting: Certified Nurse Midwife

## 2019-05-17 LAB — CYTOLOGY - PAP
Chlamydia: NEGATIVE
Comment: NEGATIVE
Comment: NORMAL
Diagnosis: NEGATIVE
Neisseria Gonorrhea: NEGATIVE

## 2019-05-18 LAB — CBC
HCT: 31.8 % — ABNORMAL LOW (ref 35.0–45.0)
Hemoglobin: 10.8 g/dL — ABNORMAL LOW (ref 11.7–15.5)
MCH: 29.6 pg (ref 27.0–33.0)
MCHC: 34 g/dL (ref 32.0–36.0)
MCV: 87.1 fL (ref 80.0–100.0)
MPV: 11.5 fL (ref 7.5–12.5)
Platelets: 237 10*3/uL (ref 140–400)
RBC: 3.65 10*6/uL — ABNORMAL LOW (ref 3.80–5.10)
RDW: 16.1 % — ABNORMAL HIGH (ref 11.0–15.0)
WBC: 9 10*3/uL (ref 3.8–10.8)

## 2019-05-18 LAB — CULTURE, STREPTOCOCCUS GRP B W/SUSCEPT
MICRO NUMBER:: 10160287
SPECIMEN QUALITY:: ADEQUATE

## 2019-05-20 ENCOUNTER — Other Ambulatory Visit: Payer: Self-pay

## 2019-05-20 ENCOUNTER — Ambulatory Visit (INDEPENDENT_AMBULATORY_CARE_PROVIDER_SITE_OTHER): Payer: BC Managed Care – PPO | Admitting: Obstetrics & Gynecology

## 2019-05-20 VITALS — BP 133/89 | HR 102 | Wt 201.0 lb

## 2019-05-20 DIAGNOSIS — Z8759 Personal history of other complications of pregnancy, childbirth and the puerperium: Secondary | ICD-10-CM

## 2019-05-20 DIAGNOSIS — O09293 Supervision of pregnancy with other poor reproductive or obstetric history, third trimester: Secondary | ICD-10-CM

## 2019-05-20 DIAGNOSIS — Z3A37 37 weeks gestation of pregnancy: Secondary | ICD-10-CM

## 2019-05-20 DIAGNOSIS — O34219 Maternal care for unspecified type scar from previous cesarean delivery: Secondary | ICD-10-CM

## 2019-05-20 DIAGNOSIS — Z3480 Encounter for supervision of other normal pregnancy, unspecified trimester: Secondary | ICD-10-CM

## 2019-05-20 NOTE — Patient Instructions (Signed)
Return to office for any scheduled appointments. Call the office or go to the MAU at [facility] [place] if:  You begin to have strong, frequent contractions  Your water breaks.  Sometimes it is a big gush of fluid, sometimes it is just a trickle that keeps getting your panties wet or running down your legs  You have vaginal bleeding.  It is normal to have a small amount of spotting if your cervix was checked.   You do not feel your baby moving like normal.  If you do not, get something to eat and drink and lay down and focus on feeling your baby move.   If your baby is still not moving like normal, you should call the office or go to MAU.  Any other obstetric concerns.   

## 2019-05-20 NOTE — Progress Notes (Signed)
   PRENATAL VISIT NOTE  Subjective:  Catherine Wu is a 29 y.o. [id number] at [redacted]w[redacted]d being seen today for ongoing prenatal care.  She is currently monitored for the following issues for this low-risk pregnancy and has Supervision of normal pregnancy, antepartum; Hx of cesarean section complicating pregnancy; History of gestational hypertension; and Anemia in pregnancy on their problem list.  Patient reports no complaints.  Contractions: Not present. Vag. Bleeding: None.  Movement: Present. Denies leaking of fluid.   The following portions of the patient's history were reviewed and updated as appropriate: allergies, current medications, past family history, past medical history, past social history, past surgical history and problem list.   Objective:   Vitals:   05/20/19 1420  BP: 133/89  Pulse: (!) 102  Weight: 201 lb (91.2 kg)    Fetal Status: Fetal Heart Rate (bpm): 144   Movement: Present     General:  Alert, oriented and cooperative. Patient is in no acute distress.  Skin: Skin is warm and dry. No rash noted.   Cardiovascular: Normal heart rate noted  Respiratory: Normal respiratory effort, no problems with respiration noted  Abdomen: Soft, gravid, appropriate for gestational age.  Pain/Pressure: Absent     Pelvic: Cervical exam deferred        Extremities: Normal range of motion.  Edema: None  Mental Status: Normal mood and affect. Normal behavior. Normal judgment and thought content.   Assessment and Plan:  Pregnancy: [id number] at [redacted]w[redacted]d 1. Hx of cesarean section x 2 complicating pregnancy Patient desires TOLAC. Emphasized that she can do this if she goes into spontaneous labor, she will not be induced.  Risks of uterine rupture and consequences discussed in detail.  All questions answered.  Patient elects for TOLAC, consent signed 05/20/2019.   2. History of gestational hypertension Watch BP closely, values today are very close. No symptoms reported.   3. Supervision of other  normal pregnancy, antepartum Term labor symptoms and general obstetric precautions including but not limited to vaginal bleeding, contractions, leaking of fluid and fetal movement were reviewed in detail with the patient. Please refer to After Visit Summary for other counseling recommendations.   Return in about 1 week (around 05/27/2019) for OFFICE OB Visit.  No future appointments.  Jaynie Collins, MD

## 2019-05-23 ENCOUNTER — Other Ambulatory Visit: Payer: Self-pay

## 2019-05-23 ENCOUNTER — Other Ambulatory Visit (INDEPENDENT_AMBULATORY_CARE_PROVIDER_SITE_OTHER): Payer: BC Managed Care – PPO

## 2019-05-23 DIAGNOSIS — O26613 Liver and biliary tract disorders in pregnancy, third trimester: Secondary | ICD-10-CM | POA: Diagnosis not present

## 2019-05-23 DIAGNOSIS — K831 Obstruction of bile duct: Secondary | ICD-10-CM

## 2019-05-23 NOTE — Progress Notes (Signed)
Pt here for labwork for Cholestasis  Per Fabian November, CNM

## 2019-05-28 ENCOUNTER — Inpatient Hospital Stay (HOSPITAL_COMMUNITY): Payer: BC Managed Care – PPO | Admitting: Anesthesiology

## 2019-05-28 ENCOUNTER — Ambulatory Visit (INDEPENDENT_AMBULATORY_CARE_PROVIDER_SITE_OTHER): Payer: BC Managed Care – PPO | Admitting: Certified Nurse Midwife

## 2019-05-28 ENCOUNTER — Other Ambulatory Visit: Payer: Self-pay

## 2019-05-28 ENCOUNTER — Encounter (HOSPITAL_COMMUNITY): Payer: Self-pay | Admitting: Family Medicine

## 2019-05-28 ENCOUNTER — Inpatient Hospital Stay (HOSPITAL_COMMUNITY)
Admission: AD | Admit: 2019-05-28 | Discharge: 2019-05-30 | DRG: 786 | Disposition: A | Discharge status: Home / Self Care | Payer: BC Managed Care – PPO | Attending: Family Medicine | Admitting: Family Medicine

## 2019-05-28 ENCOUNTER — Encounter (HOSPITAL_COMMUNITY)
Admission: AD | Disposition: A | Discharge status: Home / Self Care | Payer: Self-pay | Source: Home / Self Care | Attending: Family Medicine

## 2019-05-28 VITALS — BP 133/90 | HR 105 | Temp 99.1°F | Wt 202.0 lb

## 2019-05-28 DIAGNOSIS — O34219 Maternal care for unspecified type scar from previous cesarean delivery: Secondary | ICD-10-CM | POA: Diagnosis present

## 2019-05-28 DIAGNOSIS — O99019 Anemia complicating pregnancy, unspecified trimester: Secondary | ICD-10-CM | POA: Diagnosis present

## 2019-05-28 DIAGNOSIS — Z20822 Contact with and (suspected) exposure to covid-19: Secondary | ICD-10-CM | POA: Diagnosis present

## 2019-05-28 DIAGNOSIS — O34211 Maternal care for low transverse scar from previous cesarean delivery: Principal | ICD-10-CM | POA: Diagnosis present

## 2019-05-28 DIAGNOSIS — E669 Obesity, unspecified: Secondary | ICD-10-CM | POA: Diagnosis present

## 2019-05-28 DIAGNOSIS — Z3A38 38 weeks gestation of pregnancy: Secondary | ICD-10-CM

## 2019-05-28 DIAGNOSIS — Z8759 Personal history of other complications of pregnancy, childbirth and the puerperium: Secondary | ICD-10-CM

## 2019-05-28 DIAGNOSIS — O99214 Obesity complicating childbirth: Secondary | ICD-10-CM | POA: Diagnosis present

## 2019-05-28 DIAGNOSIS — Z880 Allergy status to penicillin: Secondary | ICD-10-CM | POA: Diagnosis not present

## 2019-05-28 DIAGNOSIS — O2662 Liver and biliary tract disorders in childbirth: Secondary | ICD-10-CM | POA: Diagnosis present

## 2019-05-28 DIAGNOSIS — K831 Obstruction of bile duct: Secondary | ICD-10-CM

## 2019-05-28 DIAGNOSIS — D62 Acute posthemorrhagic anemia: Secondary | ICD-10-CM | POA: Diagnosis not present

## 2019-05-28 DIAGNOSIS — Z3480 Encounter for supervision of other normal pregnancy, unspecified trimester: Secondary | ICD-10-CM

## 2019-05-28 DIAGNOSIS — Z3490 Encounter for supervision of normal pregnancy, unspecified, unspecified trimester: Secondary | ICD-10-CM

## 2019-05-28 DIAGNOSIS — O9081 Anemia of the puerperium: Secondary | ICD-10-CM | POA: Diagnosis not present

## 2019-05-28 DIAGNOSIS — O26613 Liver and biliary tract disorders in pregnancy, third trimester: Secondary | ICD-10-CM

## 2019-05-28 DIAGNOSIS — Z98891 History of uterine scar from previous surgery: Secondary | ICD-10-CM

## 2019-05-28 HISTORY — DX: Other specified postprocedural states: R11.2

## 2019-05-28 HISTORY — DX: Other specified postprocedural states: Z98.890

## 2019-05-28 LAB — COMPREHENSIVE METABOLIC PANEL
AG Ratio: 1.4 (calc) (ref 1.0–2.5)
ALT: 10 U/L (ref 6–29)
ALT: 21 U/L (ref 0–44)
AST: 16 U/L (ref 10–30)
AST: 27 U/L (ref 15–41)
Albumin: 3.4 g/dL — ABNORMAL LOW (ref 3.6–5.1)
Albumin: 2.9 g/dL — ABNORMAL LOW (ref 3.5–5.0)
Alkaline Phosphatase: 172 U/L — ABNORMAL HIGH (ref 38–126)
Alkaline phosphatase (APISO): 148 U/L — ABNORMAL HIGH (ref 31–125)
Anion gap: 15 (ref 5–15)
BUN/Creatinine Ratio: 7 (calc) (ref 6–22)
BUN: 5 mg/dL — ABNORMAL LOW (ref 7–25)
BUN: 6 mg/dL (ref 6–20)
CO2: 22 mmol/L (ref 20–32)
CO2: 19 mmol/L — ABNORMAL LOW (ref 22–32)
Calcium: 10.1 mg/dL (ref 8.6–10.2)
Calcium: 9.6 mg/dL (ref 8.9–10.3)
Chloride: 102 mmol/L (ref 98–110)
Chloride: 102 mmol/L (ref 98–111)
Creat: 0.68 mg/dL (ref 0.50–1.10)
Creatinine, Ser: 0.77 mg/dL (ref 0.44–1.00)
GFR calc Af Amer: >60 mL/min (ref >60)
GFR calc non Af Amer: >60 mL/min (ref >60)
Globulin: 2.4 g/dL (calc) (ref 1.9–3.7)
Glucose, Bld: 102 mg/dL — ABNORMAL HIGH (ref 65–99)
Glucose, Bld: 77 mg/dL (ref 70–99)
Potassium: 4 mmol/L (ref 3.5–5.3)
Potassium: 3.9 mmol/L (ref 3.5–5.1)
Sodium: 135 mmol/L (ref 135–146)
Sodium: 136 mmol/L (ref 135–145)
Total Bilirubin: 0.7 mg/dL (ref 0.2–1.2)
Total Bilirubin: 1.3 mg/dL — ABNORMAL HIGH (ref 0.3–1.2)
Total Protein: 5.8 g/dL — ABNORMAL LOW (ref 6.1–8.1)
Total Protein: 6.5 g/dL (ref 6.5–8.1)

## 2019-05-28 LAB — TYPE AND SCREEN
ABO/RH(D): A POS
Antibody Screen: NEGATIVE

## 2019-05-28 LAB — CBC
HCT: 39.6 % (ref 36.0–46.0)
Hemoglobin: 12.5 g/dL (ref 12.0–15.0)
MCH: 29.8 pg (ref 26.0–34.0)
MCHC: 31.6 g/dL (ref 30.0–36.0)
MCV: 94.3 fL (ref 80.0–100.0)
Platelets: 238 10*3/uL (ref 150–400)
RBC: 4.2 MIL/uL (ref 3.87–5.11)
RDW: 17.6 % — ABNORMAL HIGH (ref 11.5–15.5)
WBC: 9 10*3/uL (ref 4.0–10.5)
nRBC: 0.2 % (ref 0.0–0.2)

## 2019-05-28 LAB — RESPIRATORY PANEL BY RT PCR (FLU A&B, COVID)
Influenza A by PCR: NEGATIVE
Influenza B by PCR: NEGATIVE
SARS Coronavirus 2 by RT PCR: NEGATIVE

## 2019-05-28 LAB — BILE ACIDS, TOTAL: Bile Acids Total: 20 umol/L — ABNORMAL HIGH (ref 0–19)

## 2019-05-28 LAB — ABO/RH: ABO/RH(D): A POS

## 2019-05-28 SURGERY — Surgical Case
Anesthesia: Spinal

## 2019-05-28 MED ORDER — WITCH HAZEL-GLYCERIN EX PADS: 1.0000 "application " | MEDICATED_PAD | CUTANEOUS | Status: DC | PRN

## 2019-05-28 MED ORDER — STERILE WATER FOR IRRIGATION IR SOLN
Status: DC | PRN
  Administered 2019-05-28: 1

## 2019-05-28 MED ORDER — SCOPOLAMINE 1 MG/3DAYS TD PT72
MEDICATED_PATCH | TRANSDERMAL | Status: AC
  Filled 2019-05-28: qty 1

## 2019-05-28 MED ORDER — MORPHINE SULFATE (PF) 0.5 MG/ML IJ SOLN
INTRAMUSCULAR | Status: AC
  Filled 2019-05-28: qty 10

## 2019-05-28 MED ORDER — OXYTOCIN 40 UNITS IN NORMAL SALINE INFUSION - SIMPLE MED
INTRAVENOUS | Status: DC | PRN
  Administered 2019-05-28: 500 mL via INTRAVENOUS

## 2019-05-28 MED ORDER — NALBUPHINE HCL 10 MG/ML IJ SOLN
5.0000 mg | Freq: Once | INTRAMUSCULAR | Status: AC | PRN
  Administered 2019-05-28: 5 mg via SUBCUTANEOUS

## 2019-05-28 MED ORDER — SIMETHICONE 80 MG PO CHEW: 80.0000 mg | CHEWABLE_TABLET | ORAL | Status: DC | PRN

## 2019-05-28 MED ORDER — SENNOSIDES-DOCUSATE SODIUM 8.6-50 MG PO TABS
2.0000 | ORAL_TABLET | ORAL | Status: DC
  Administered 2019-05-29 (×2): 2 via ORAL
  Filled 2019-05-28 (×2): qty 2

## 2019-05-28 MED ORDER — FENTANYL CITRATE (PF) 100 MCG/2ML IJ SOLN: 25.0000 ug | INTRAMUSCULAR | Status: DC | PRN

## 2019-05-28 MED ORDER — ACETAMINOPHEN 10 MG/ML IV SOLN
INTRAVENOUS | Status: AC
  Filled 2019-05-28: qty 100

## 2019-05-28 MED ORDER — BUPIVACAINE IN DEXTROSE 0.75-8.25 % IT SOLN
INTRATHECAL | Status: DC | PRN
  Administered 2019-05-28: 1.3 mL via INTRATHECAL
  Administered 2019-05-28: 1.6 mL via INTRATHECAL

## 2019-05-28 MED ORDER — DIPHENHYDRAMINE HCL 25 MG PO CAPS
25.0000 mg | ORAL_CAPSULE | Freq: Four times a day (QID) | ORAL | Status: DC | PRN
  Administered 2019-05-28: 25 mg via ORAL
  Filled 2019-05-28: qty 1

## 2019-05-28 MED ORDER — SIMETHICONE 80 MG PO CHEW
80.0000 mg | CHEWABLE_TABLET | ORAL | Status: DC
  Administered 2019-05-29 (×2): 80 mg via ORAL
  Filled 2019-05-28 (×2): qty 1

## 2019-05-28 MED ORDER — ACETAMINOPHEN 500 MG PO TABS: 1000.0000 mg | ORAL_TABLET | Freq: Once | ORAL | Status: DC

## 2019-05-28 MED ORDER — SOD CITRATE-CITRIC ACID 500-334 MG/5ML PO SOLN: 30.0000 mL | ORAL | Status: DC

## 2019-05-28 MED ORDER — ONDANSETRON HCL 4 MG/2ML IJ SOLN
INTRAMUSCULAR | Status: DC | PRN
  Administered 2019-05-28: 4 mg via INTRAVENOUS

## 2019-05-28 MED ORDER — COCONUT OIL OIL: 1.0000 "application " | TOPICAL_OIL | Status: DC | PRN

## 2019-05-28 MED ORDER — NALBUPHINE HCL 10 MG/ML IJ SOLN: 5.0000 mg | Freq: Once | INTRAMUSCULAR | Status: AC | PRN

## 2019-05-28 MED ORDER — KETOROLAC TROMETHAMINE 30 MG/ML IJ SOLN
30.0000 mg | Freq: Four times a day (QID) | INTRAMUSCULAR | Status: AC | PRN
  Administered 2019-05-28: 30 mg via INTRAMUSCULAR

## 2019-05-28 MED ORDER — KETOROLAC TROMETHAMINE 30 MG/ML IJ SOLN: 30.0000 mg | Freq: Once | INTRAMUSCULAR | Status: DC

## 2019-05-28 MED ORDER — NALBUPHINE HCL 10 MG/ML IJ SOLN: 5.0000 mg | INTRAMUSCULAR | Status: DC | PRN

## 2019-05-28 MED ORDER — ONDANSETRON HCL 4 MG/2ML IJ SOLN
INTRAMUSCULAR | Status: AC
  Filled 2019-05-28: qty 2

## 2019-05-28 MED ORDER — NALOXONE HCL 4 MG/10ML IJ SOLN
1.0000 ug/kg/h | INTRAVENOUS | Status: DC | PRN
  Filled 2019-05-28: qty 5

## 2019-05-28 MED ORDER — NALOXONE HCL 0.4 MG/ML IJ SOLN: 0.4000 mg | INTRAMUSCULAR | Status: DC | PRN

## 2019-05-28 MED ORDER — DIBUCAINE (PERIANAL) 1 % EX OINT: 1.0000 "application " | TOPICAL_OINTMENT | CUTANEOUS | Status: DC | PRN

## 2019-05-28 MED ORDER — OXYTOCIN 40 UNITS IN NORMAL SALINE INFUSION - SIMPLE MED
INTRAVENOUS | Status: AC
  Filled 2019-05-28: qty 1000

## 2019-05-28 MED ORDER — ONDANSETRON HCL 4 MG/2ML IJ SOLN: 4.0000 mg | Freq: Three times a day (TID) | INTRAMUSCULAR | Status: DC | PRN

## 2019-05-28 MED ORDER — DIPHENHYDRAMINE HCL 50 MG/ML IJ SOLN: 12.5000 mg | INTRAMUSCULAR | Status: DC | PRN

## 2019-05-28 MED ORDER — LACTATED RINGERS IV SOLN: INTRAVENOUS | Status: DC

## 2019-05-28 MED ORDER — MORPHINE SULFATE (PF) 0.5 MG/ML IJ SOLN
INTRAMUSCULAR | Status: DC | PRN
  Administered 2019-05-28 (×2): .15 mg via INTRATHECAL

## 2019-05-28 MED ORDER — KETOROLAC TROMETHAMINE 30 MG/ML IJ SOLN
INTRAMUSCULAR | Status: AC
  Filled 2019-05-28: qty 1

## 2019-05-28 MED ORDER — CLINDAMYCIN PHOSPHATE 900 MG/50ML IV SOLN
900.0000 mg | INTRAVENOUS | Status: AC
  Administered 2019-05-28: 900 mg via INTRAVENOUS

## 2019-05-28 MED ORDER — FENTANYL CITRATE (PF) 100 MCG/2ML IJ SOLN
INTRAMUSCULAR | Status: AC
  Filled 2019-05-28: qty 2

## 2019-05-28 MED ORDER — PHENYLEPHRINE HCL-NACL 20-0.9 MG/250ML-% IV SOLN
INTRAVENOUS | Status: DC | PRN
  Administered 2019-05-28: 60 ug/min via INTRAVENOUS

## 2019-05-28 MED ORDER — IBUPROFEN 800 MG PO TABS
800.0000 mg | ORAL_TABLET | Freq: Three times a day (TID) | ORAL | Status: DC
  Administered 2019-05-29 – 2019-05-30 (×2): 800 mg via ORAL
  Filled 2019-05-28 (×2): qty 1

## 2019-05-28 MED ORDER — SIMETHICONE 80 MG PO CHEW
80.0000 mg | CHEWABLE_TABLET | Freq: Three times a day (TID) | ORAL | Status: DC
  Administered 2019-05-29 – 2019-05-30 (×3): 80 mg via ORAL
  Filled 2019-05-28 (×3): qty 1

## 2019-05-28 MED ORDER — KETOROLAC TROMETHAMINE 30 MG/ML IJ SOLN: 30.0000 mg | Freq: Four times a day (QID) | INTRAMUSCULAR | Status: AC | PRN

## 2019-05-28 MED ORDER — SCOPOLAMINE 1 MG/3DAYS TD PT72
1.0000 | MEDICATED_PATCH | TRANSDERMAL | Status: DC
  Administered 2019-05-28: 1.5 mg via TRANSDERMAL

## 2019-05-28 MED ORDER — PROMETHAZINE HCL 25 MG/ML IJ SOLN: 6.2500 mg | INTRAMUSCULAR | Status: DC | PRN

## 2019-05-28 MED ORDER — DEXAMETHASONE SODIUM PHOSPHATE 10 MG/ML IJ SOLN
INTRAMUSCULAR | Status: DC | PRN
  Administered 2019-05-28: 10 mg via INTRAVENOUS

## 2019-05-28 MED ORDER — MENTHOL 3 MG MT LOZG: 1.0000 | LOZENGE | OROMUCOSAL | Status: DC | PRN

## 2019-05-28 MED ORDER — GENTAMICIN SULFATE 40 MG/ML IJ SOLN
5.0000 mg/kg | INTRAVENOUS | Status: AC
  Administered 2019-05-28: 458 mg via INTRAVENOUS
  Filled 2019-05-28: qty 11.5

## 2019-05-28 MED ORDER — CLINDAMYCIN PHOSPHATE 900 MG/50ML IV SOLN
INTRAVENOUS | Status: AC
  Filled 2019-05-28: qty 50

## 2019-05-28 MED ORDER — OXYTOCIN 40 UNITS IN NORMAL SALINE INFUSION - SIMPLE MED: 2.5000 [IU]/h | INTRAVENOUS | Status: AC

## 2019-05-28 MED ORDER — ENOXAPARIN SODIUM 40 MG/0.4ML ~~LOC~~ SOLN
40.0000 mg | SUBCUTANEOUS | Status: DC
  Filled 2019-05-28 (×2): qty 0.4

## 2019-05-28 MED ORDER — FENTANYL CITRATE (PF) 100 MCG/2ML IJ SOLN
INTRAMUSCULAR | Status: DC | PRN
  Administered 2019-05-28 (×2): 15 ug via INTRATHECAL

## 2019-05-28 MED ORDER — ACETAMINOPHEN 160 MG/5ML PO SOLN: 1000.0000 mg | Freq: Once | ORAL | Status: DC

## 2019-05-28 MED ORDER — SODIUM CHLORIDE 0.9% FLUSH: 3.0000 mL | INTRAVENOUS | Status: DC | PRN

## 2019-05-28 MED ORDER — PHENYLEPHRINE HCL-NACL 20-0.9 MG/250ML-% IV SOLN
INTRAVENOUS | Status: AC
  Filled 2019-05-28: qty 250

## 2019-05-28 MED ORDER — PRENATAL MULTIVITAMIN CH
1.0000 | ORAL_TABLET | Freq: Every day | ORAL | Status: DC
  Administered 2019-05-29: 1 via ORAL
  Filled 2019-05-28: qty 1

## 2019-05-28 MED ORDER — NALBUPHINE HCL 10 MG/ML IJ SOLN
INTRAMUSCULAR | Status: AC
  Filled 2019-05-28: qty 1

## 2019-05-28 MED ORDER — DEXAMETHASONE SODIUM PHOSPHATE 10 MG/ML IJ SOLN
INTRAMUSCULAR | Status: AC
  Filled 2019-05-28: qty 1

## 2019-05-28 MED ORDER — HYDROCODONE-ACETAMINOPHEN 5-325 MG PO TABS
1.0000 | ORAL_TABLET | ORAL | Status: DC | PRN
  Administered 2019-05-30: 1 via ORAL
  Filled 2019-05-28: qty 1

## 2019-05-28 MED ORDER — ACETAMINOPHEN 500 MG PO TABS
1000.0000 mg | ORAL_TABLET | Freq: Four times a day (QID) | ORAL | Status: AC
  Administered 2019-05-29 (×4): 1000 mg via ORAL
  Filled 2019-05-28 (×4): qty 2

## 2019-05-28 MED ORDER — SODIUM CHLORIDE 0.9 % IR SOLN
Status: DC | PRN
  Administered 2019-05-28: 1

## 2019-05-28 MED ORDER — KETOROLAC TROMETHAMINE 30 MG/ML IJ SOLN
30.0000 mg | Freq: Four times a day (QID) | INTRAMUSCULAR | Status: AC
  Administered 2019-05-29 (×3): 30 mg via INTRAVENOUS
  Filled 2019-05-28 (×3): qty 1

## 2019-05-28 MED ORDER — DIPHENHYDRAMINE HCL 25 MG PO CAPS: 25.0000 mg | ORAL_CAPSULE | ORAL | Status: DC | PRN

## 2019-05-28 SURGICAL SUPPLY — 36 items
BENZOIN TINCTURE PRP APPL 2/3 (GAUZE/BANDAGES/DRESSINGS) ×3 IMPLANT
CHLORAPREP W/TINT 26ML (MISCELLANEOUS) ×3 IMPLANT
CLAMP CORD UMBIL (MISCELLANEOUS) IMPLANT
CLOSURE WOUND 1/2 X4 (GAUZE/BANDAGES/DRESSINGS) ×1
CLOTH BEACON ORANGE TIMEOUT ST (SAFETY) ×3 IMPLANT
DRAPE C SECTION CLR SCREEN (DRAPES) ×2 IMPLANT
DRSG OPSITE POSTOP 4X10 (GAUZE/BANDAGES/DRESSINGS) ×3 IMPLANT
DRSG PAD ABDOMINAL 8X10 ST (GAUZE/BANDAGES/DRESSINGS) ×2 IMPLANT
ELECT REM PT RETURN 9FT ADLT (ELECTROSURGICAL) ×3
ELECTRODE REM PT RTRN 9FT ADLT (ELECTROSURGICAL) ×1 IMPLANT
EXTRACTOR VACUUM M CUP 4 TUBE (SUCTIONS) IMPLANT
EXTRACTOR VACUUM M CUP 4' TUBE (SUCTIONS)
GLOVE BIOGEL PI IND STRL 7.0 (GLOVE) ×2 IMPLANT
GLOVE BIOGEL PI IND STRL 7.5 (GLOVE) ×2 IMPLANT
GLOVE BIOGEL PI INDICATOR 7.0 (GLOVE) ×4
GLOVE BIOGEL PI INDICATOR 7.5 (GLOVE) ×4
GLOVE ECLIPSE 7.5 STRL STRAW (GLOVE) ×3 IMPLANT
GOWN STRL REUS W/TWL LRG LVL3 (GOWN DISPOSABLE) ×9 IMPLANT
KIT ABG SYR 3ML LUER SLIP (SYRINGE) IMPLANT
NDL HYPO 25X5/8 SAFETYGLIDE (NEEDLE) IMPLANT
NEEDLE HYPO 25X5/8 SAFETYGLIDE (NEEDLE) IMPLANT
NS IRRIG 1000ML POUR BTL (IV SOLUTION) ×3 IMPLANT
PACK C SECTION WH (CUSTOM PROCEDURE TRAY) ×3 IMPLANT
PAD OB MATERNITY 4.3X12.25 (PERSONAL CARE ITEMS) ×3 IMPLANT
PENCIL SMOKE EVAC W/HOLSTER (ELECTROSURGICAL) ×3 IMPLANT
RTRCTR C-SECT PINK 25CM LRG (MISCELLANEOUS) ×3 IMPLANT
SPONGE GAUZE 4X4 12PLY STER LF (GAUZE/BANDAGES/DRESSINGS) ×4 IMPLANT
STRIP CLOSURE SKIN 1/2X4 (GAUZE/BANDAGES/DRESSINGS) ×2 IMPLANT
SUT VIC AB 0 CTX 36 (SUTURE) ×6
SUT VIC AB 0 CTX36XBRD ANBCTRL (SUTURE) ×3 IMPLANT
SUT VIC AB 2-0 CT1 27 (SUTURE) ×2
SUT VIC AB 2-0 CT1 TAPERPNT 27 (SUTURE) ×1 IMPLANT
SUT VIC AB 4-0 KS 27 (SUTURE) ×3 IMPLANT
TOWEL OR 17X24 6PK STRL BLUE (TOWEL DISPOSABLE) ×3 IMPLANT
TRAY FOLEY W/BAG SLVR 14FR LF (SET/KITS/TRAYS/PACK) ×3 IMPLANT
WATER STERILE IRR 1000ML POUR (IV SOLUTION) ×3 IMPLANT

## 2019-05-28 NOTE — Anesthesia Postprocedure Evaluation (Signed)
Anesthesia Post Note  Patient: Tauheedah Bok  Procedure(s) Performed: CESAREAN SECTION (N/A )     Patient location during evaluation: PACU Anesthesia Type: Spinal Level of consciousness: awake and alert and oriented Pain management: pain level controlled Vital Signs Assessment: post-procedure vital signs reviewed and stable Respiratory status: spontaneous breathing, nonlabored ventilation and respiratory function stable Cardiovascular status: blood pressure returned to baseline and stable Postop Assessment: no headache, no backache, spinal receding and patient able to bend at knees Anesthetic complications: no    Last Vitals:  Vitals:   05/28/19 1730 05/28/19 1745  BP: 104/76 104/60  Pulse: (!) 114 (!) 121  Resp: (!) 23 19  Temp:    SpO2: 99% 98%    Last Pain:  Vitals:   05/28/19 1745  TempSrc:   PainSc: 0-No pain   Pain Goal: Patients Stated Pain Goal: 5 (05/28/19 1416)  LLE Motor Response: No movement due to regional block (05/28/19 1745) LLE Sensation: Tingling (05/28/19 1745) RLE Motor Response: No movement due to regional block (05/28/19 1745) RLE Sensation: Tingling (05/28/19 1745)     Epidural/Spinal Function Cutaneous sensation: Tingles (05/28/19 1745), Patient able to flex knees: No (05/28/19 1745), Patient able to lift hips off bed: No (05/28/19 1745), Back pain beyond tenderness at insertion site: No (05/28/19 1745), Progressively worsening motor and/or sensory loss: No (05/28/19 1745), Bowel and/or bladder incontinence post epidural: No (05/28/19 1745)  Lannie Fields

## 2019-05-28 NOTE — Discharge Summary (Signed)
Postpartum Discharge Summary     Patient Name: Catherine Wu DOB: Dec 17, 1990 MRN: [id number]  Date of admission: 05/28/2019 Delivering Provider: Truett Mainland   Date of discharge: 05/30/2019  Admitting diagnosis: History of 2 cesarean sections [Z98.891] Intrauterine pregnancy: [redacted]w[redacted]d    Secondary diagnosis:  Active Problems:   Supervision of normal pregnancy, antepartum   Hx of cesarean section complicating pregnancy   History of gestational hypertension   Anemia in pregnancy   Cholestasis during pregnancy in third trimester   History of 2 cesarean sections  Additional problems: none     Discharge diagnosis: Term Pregnancy Delivered                                                                                                Post partum procedures:feraheme infusion  Augmentation: n/a  Complications: EBL [id number] Hospital course:  Sceduled C/S   29y.o. yo [id number] admitted to the hospital 05/28/2019 for scheduled cesarean section with the following indication:history of two prior cesarean, cholestasis of pregnancy.  Membrane Rupture Time/Date: 4:30 PM ,05/28/2019   Patient delivered a Viable infant.05/28/2019  Details of operation can be found in separate operative note.  Pateint had an uncomplicated postpartum course. Received IV iron. Would like to use vaginal film for birth control. She is ambulating, tolerating a regular diet, passing flatus, and urinating well. Patient is discharged home in stable condition on  05/30/19        Delivery time: 4:31 PM    Magnesium Sulfate received: No BMZ received: No Rhophylac:N/A MMR:N/A Transfusion:No  Physical exam  Vitals:   05/29/19 0718 05/29/19 1434 05/29/19 2257 05/30/19 0501  BP: 109/65 117/65 127/79 123/78  Pulse: 62 75 77 82  Resp: '18 17 18 16  ' Temp: 98.4 F (36.9 C) 98.4 F (36.9 C) 98.4 F (36.9 C) 98.2 F (36.8 C)  TempSrc: Oral Oral Oral Oral  SpO2: 99% 99% 99%   Weight:      Height:       General:  alert, cooperative and no distress Lochia: appropriate Uterine Fundus: firm Incision: Healing well with no significant drainage, No significant erythema, Dressing is clean, dry, and intact DVT Evaluation: No evidence of DVT seen on physical exam. No cords or calf tenderness. No significant calf/ankle edema. Labs: Lab Results  Component Value Date   WBC 12.0 (H) 05/29/2019   HGB 8.2 (L) 05/29/2019   HCT 25.5 (L) 05/29/2019   MCV 92.7 05/29/2019   PLT 156 05/29/2019   CMP Latest Ref Rng & Units 05/28/2019  Glucose 70 - 99 mg/dL 77  BUN 6 - 20 mg/dL 6  Creatinine 0.44 - 1.00 mg/dL 0.77  Sodium 135 - 145 mmol/L 136  Potassium 3.5 - 5.1 mmol/L 3.9  Chloride 98 - 111 mmol/L 102  CO2 22 - 32 mmol/L 19(L)  Calcium 8.9 - 10.3 mg/dL 9.6  Total Protein 6.5 - 8.1 g/dL 6.5  Total Bilirubin 0.3 - 1.2 mg/dL 1.3(H)  Alkaline Phos 38 - 126 U/L 172(H)  AST 15 - 41 U/L 27  ALT 0 - 44 U/L 21  Edinburgh Score: Edinburgh Postnatal Depression Scale Screening Tool 05/30/2019  I have been able to laugh and see the funny side of things. 0  I have looked forward with enjoyment to things. 0  I have blamed myself unnecessarily when things went wrong. 1  I have been anxious or worried for no good reason. 1  I have felt scared or panicky for no good reason. 2  Things have been getting on top of me. 2  I have been so unhappy that I have had difficulty sleeping. 1  I have felt sad or miserable. 1  I have been so unhappy that I have been crying. 1  The thought of harming myself has occurred to me. 0  Edinburgh Postnatal Depression Scale Total 9    Discharge instruction: per After Visit Summary and "Baby and Me Booklet".  After visit meds:  Allergies as of 05/30/2019      Reactions   Penicillins Other (See Comments)   Unknown childhood reaction. Did it involve swelling of the face/tongue/throat, SOB, or low BP? Unknown Did it involve sudden or severe rash/hives, skin peeling, or any reaction on the  inside of your mouth or nose? Unknown Did you need to seek medical attention at a hospital or doctor's office? Unknown When did it last happen?Early childhood If all above answers are "NO", may proceed with cephalosporin use.      Medication List    STOP taking these medications   aspirin EC 81 MG tablet   Evening Primrose Oil 1000 MG Caps   iron polysaccharides 150 MG capsule Commonly known as: NIFEREX     TAKE these medications   acetaminophen 325 MG tablet Commonly known as: Tylenol Take 2 tablets (650 mg total) by mouth every 6 (six) hours as needed.   famotidine 10 MG tablet Commonly known as: PEPCID Take 10 mg by mouth 2 (two) times daily as needed for heartburn.   ibuprofen 800 MG tablet Commonly known as: ADVIL Take 1 tablet (800 mg total) by mouth every 8 (eight) hours.   oxyCODONE 5 MG immediate release tablet Commonly known as: Roxicodone Take 1 tablet (5 mg total) by mouth every 4 (four) hours as needed for severe pain.   polyethylene glycol 17 g packet Commonly known as: MIRALAX / GLYCOLAX Take 17 g by mouth daily.   prenatal multivitamin Tabs tablet Take 1 tablet by mouth daily at 12 noon.   senna-docusate 8.6-50 MG tablet Commonly known as: Senokot-S Take 2 tablets by mouth daily. Start taking on: May 31, 2019       Diet: routine diet  Activity: Advance as tolerated. Pelvic rest for 6 weeks.   Outpatient follow up:6 weeks Follow up Appt: Future Appointments  Date Time Provider [place]  06/11/2019  2:00 PM Donnamae Jude, MD [facility]  07/09/2019  2:00 PM Lajean Manes, CNM [facility]   Follow up Visit:   Please schedule this patient for Postpartum visit in: 6 weeks with the following provider: Any provider Virtual For C/S patients schedule nurse incision check in weeks 2 weeks: yes High risk pregnancy complicated by: cholestasis, hx of CS x2 Delivery mode:  CS Anticipated Birth Control:   other/unsure PP Procedures needed: Incision check. Hgb recheck: 12.9>8.2>IV feraheme infusion  Schedule Integrated [place] visit: no   Newborn Data: Live born female  Birth Weight:  3893 APGAR: 70, 8  Newborn Delivery   Birth date/time: 05/28/2019 16:31:00 Delivery type: C-Section, Low Transverse Trial of labor: No C-section  categorization: Repeat      Baby Feeding: Breast Disposition:home with mother   05/30/2019 Chauncey Mann, MD

## 2019-05-28 NOTE — Anesthesia Procedure Notes (Signed)
Spinal  Patient location during procedure: OR Start time: 05/28/2019 3:40 PM End time: 05/28/2019 4:06 PM Staffing Performed: anesthesiologist  Anesthesiologist: Lannie Fields, DO Preanesthetic Checklist Completed: patient identified, IV checked, risks and benefits discussed, surgical consent, monitors and equipment checked, pre-op evaluation and timeout performed Spinal Block Patient position: sitting Prep: DuraPrep and site prepped and draped Patient monitoring: cardiac monitor, continuous pulse ox and blood pressure Approach: midline Location: L3-4 Injection technique: single-shot Needle Needle type: Pencan  Needle gauge: 24 G Needle length: 9 cm Assessment Sensory level: T6 Additional Notes Clear CSF swirl with first spinal at beginning and end of injection, however spinal failed to set up. Performed 2nd spinal at reduced dose without issue.

## 2019-05-28 NOTE — Op Note (Signed)
Catherine Wu PROCEDURE DATE: 05/28/2019  PREOPERATIVE DIAGNOSIS: Intrauterine pregnancy at  [redacted]w[redacted]d weeks gestation; history of two prior cesarean sections; cholestasis of pregnancy  POSTOPERATIVE DIAGNOSIS: The same  PROCEDURE: Repeat Low Transverse Cesarean Section  SURGEON:  Dr. Loma Boston  ASSISTANT: Dr. Annice Needy Dione Plover  INDICATIONS: Catherine Wu is a 29 y.o. T7D2202 at [redacted]w[redacted]d scheduled for cesarean section secondary to history of cesarean x2 and cholestasis of pregnancy.  The risks of cesarean section discussed with the patient included but were not limited to: bleeding which may require transfusion or reoperation; infection which may require antibiotics; injury to bowel, bladder, ureters or other surrounding organs; injury to the fetus; need for additional procedures including hysterectomy in the event of a life-threatening hemorrhage; placental abnormalities wth subsequent pregnancies, incisional problems, thromboembolic phenomenon and other postoperative/anesthesia complications. The patient concurred with the proposed plan, giving informed written consent for the procedure.    FINDINGS: Lower uterine segment with undissolved suture still visible below the serosa. Viable female infant in cephalic presentation.  Apgars 8 and 8, weight 4179 grams. Clear amniotic fluid.  Intact placenta, three vessel cord.  Normal uterus, fallopian tubes and ovaries bilaterally.  ANESTHESIA:    Spinal INTRAVENOUS FLUIDS: 1500 ml ESTIMATED BLOOD LOSS: 457 ml URINE OUTPUT:  300 ml SPECIMENS: Placenta sent to L&D COMPLICATIONS: None immediate  PROCEDURE IN DETAIL:  The patient received intravenous antibiotics and had sequential compression devices applied to her lower extremities while in the preoperative area.  She was then taken to the operating room where spinal anesthesia was administered (epidural anesthesia was dosed up to surgical level) and was found to be adequate. She was then placed in a dorsal  supine position with a leftward tilt, and prepped and draped in a sterile manner.  A foley catheter was placed into her bladder and attached to constant gravity, which drained clear fluid throughout.  After an adequate timeout was performed, a Pfannenstiel skin incision was made with scalpel and carried through to the underlying layer of fascia. The fascia was incised in the midline and this incision was extended bilaterally using the Mayo scissors. Kocher clamps were applied to the superior aspect of the fascial incision and the underlying rectus muscles were dissected off bluntly. A similar process was carried out on the inferior aspect of the facial incision. The rectus muscles were separated in the midline bluntly and the peritoneum was entered bluntly. An Alexis retractor was placed to aid in visualization of the uterus.  Attention was turned to the lower uterine segment where a transverse hysterotomy was made with a scalpel and extended bilaterally bluntly. The infant was successfully delivered, and cord was clamped and cut and infant was handed over to awaiting neonatology team. Uterine massage was then administered and the placenta delivered intact with three-vessel cord. The uterus was then cleared of clot and debris.  The hysterotomy was closed with 0 Vicryl in a running locked fashion, and an imbricating layer was also placed with a 0 Vicryl. Overall, excellent hemostasis was noted. The abdomen and the pelvis were cleared of all clot and debris and the Ubaldo Glassing was removed. Hemostasis was confirmed on all surfaces.  The peritoneum was reapproximated using 2-0 vicryl running stitches. The fascia was then closed using 0 Vicryl in a running fashion. The subcutaneous layer was reapproximated with plain gut and the skin was closed with 4-0 vicryl. The patient tolerated the procedure well. Sponge, lap, instrument and needle counts were correct x 2. She was taken to the  recovery room in stable condition.     Catherine Maples, MD 05/28/2019 5:27 PM

## 2019-05-28 NOTE — Anesthesia Preprocedure Evaluation (Addendum)
Anesthesia Evaluation  Patient identified by MRN, date of birth, ID band Patient awake    Reviewed: Allergy & Precautions, NPO status , Patient's Chart, lab work & pertinent test results  History of Anesthesia Complications Negative for: history of anesthetic complications  Airway Mallampati: II  TM Distance: >3 FB Neck ROM: Full    Dental no notable dental hx. (+) Teeth Intact, Dental Advisory Given   Pulmonary neg pulmonary ROS,    Pulmonary exam normal breath sounds clear to auscultation       Cardiovascular negative cardio ROS Normal cardiovascular exam Rhythm:Regular Rate:Normal     Neuro/Psych negative neurological ROS  negative psych ROS   GI/Hepatic Neg liver ROS, GERD  Medicated and Controlled,Cholestasis of pregnancy- c/o pruritis and elevated bile acids    Endo/Other  Obesity BMI 37  Renal/GU negative Renal ROS  negative genitourinary   Musculoskeletal negative musculoskeletal ROS (+)   Abdominal Normal abdominal exam  (+)   Peds  Hematology  (+) anemia , Hgb 12.5, plt 238   Anesthesia Other Findings Day of surgery medications reviewed with patient.  Reproductive/Obstetrics (+) Pregnancy (Hx of C/S x1) Hx one prior c section                            Anesthesia Physical Anesthesia Plan  ASA: III  Anesthesia Plan: Spinal   Post-op Pain Management:    Induction:   PONV Risk Score and Plan: 4 or greater and Treatment may vary due to age or medical condition, Ondansetron and Dexamethasone  Airway Management Planned: Natural Airway  Additional Equipment: None  Intra-op Plan:   Post-operative Plan:   Informed Consent: I have reviewed the patients History and Physical, chart, labs and discussed the procedure including the risks, benefits and alternatives for the proposed anesthesia with the patient or authorized representative who has indicated his/her understanding  and acceptance.       Plan Discussed with: CRNA  Anesthesia Plan Comments:        Anesthesia Quick Evaluation

## 2019-05-28 NOTE — Progress Notes (Signed)
   PRENATAL VISIT NOTE  Subjective:  Catherine Wu is a 29 y.o. [id number] at [redacted]w[redacted]d being seen today for ongoing prenatal care.  She is currently monitored for the following issues for this high-risk pregnancy and has Supervision of normal pregnancy, antepartum; Hx of cesarean section complicating pregnancy; History of gestational hypertension; Anemia in pregnancy; and Cholestasis during pregnancy in third trimester on their problem list.  Patient reports continued pruritus .  Contractions: Not present. Vag. Bleeding: None.  Movement: Present. Denies leaking of fluid.   The following portions of the patient's history were reviewed and updated as appropriate: allergies, current medications, past family history, past medical history, past social history, past surgical history and problem list.   Objective:   Vitals:   05/28/19 0816  BP: 133/90  Pulse: (!) 105  Temp: 99.1 F (37.3 C)  Weight: 202 lb (91.6 kg)    Fetal Status: Fetal Heart Rate (bpm): 147   Movement: Present     General:  Alert, oriented and cooperative. Patient is in no acute distress.  Skin: Skin is warm and dry. No rash noted.   Cardiovascular: Normal heart rate noted  Respiratory: Normal respiratory effort, no problems with respiration noted  Abdomen: Soft, gravid, appropriate for gestational age.  Pain/Pressure: Absent     Pelvic: Cervical exam deferred        Extremities: Normal range of motion.  Edema: None  Mental Status: Normal mood and affect. Normal behavior. Normal judgment and thought content.   Assessment and Plan:  Pregnancy: [id number] at [redacted]w[redacted]d 1. Supervision of other normal pregnancy, antepartum - Patient notified this morning through mychart of cholestasis diagnosed  - patient is tearful d/t wanting a TOLAC  - Educated and discussed cholestasis during pregnancy and the importance of delivery after 37 weeks, patient verbalizes understanding and okay to scheduled C/S  - L&D called to schedule, discussed  with lisa RN and and Dr Adrian Blackwater - repeat C/S scheduled for this afternoon  - Instructed patient to maintain NPO status and present to [facility] around 1230/1 for labs and COVID testing for C/S this afternoon, patient verbalizes understanding   2. Hx of cesarean section complicating pregnancy  3. Cholestasis during pregnancy in third trimester  4. History of gestational hypertension   Term labor symptoms and general obstetric precautions including but not limited to vaginal bleeding, contractions, leaking of fluid and fetal movement were reviewed in detail with the patient. Please refer to After Visit Summary for other counseling recommendations.    Future Appointments  Date Time Provider Department Center  06/11/2019  2:00 PM Reva Bores, MD [facility]  07/09/2019  2:00 PM Sharyon Cable, CNM [facility]    Sharyon Cable, CNM

## 2019-05-28 NOTE — H&P (Signed)
LABOR AND DELIVERY ADMISSION HISTORY AND PHYSICAL NOTE  Fiana Gladu is a 29 y.o. female [id number] with IUP at [redacted]w[redacted]d by LMP c/w 20wk [place] presenting for repeat CS.   Patient has recently been reporting pruritus and bile acids sent a week ago were elevated at 20 Seen today in clinic and sent for scheduled CS due to cholestasis at >[redacted]wks GA   She reports positive fetal movement. She denies leakage of fluid or vaginal bleeding.   She plans on breast feeding. Her contraception plan is: unsure.  Prenatal History/Complications: PNC at [facility]:  @[redacted]w[redacted]d , CWD, normal anatomy, transverse presentation, posterior placenta, 87%ile, EFW 063  Pregnancy complications:  - cholestasis of pregnancy - CS x2  Past Medical History: History reviewed. No pertinent past medical history.  Past Surgical History: Past Surgical History:  Procedure Laterality Date  . CESAREAN SECTION      Obstetrical History: OB History    Gravida  5   Para  2   Term  2   Preterm      AB  2   Living  2     SAB  2   TAB      Ectopic      Multiple      Live Births  2           Social History: Social History   Socioeconomic History  . Marital status: Married    Spouse name: Not on file  . Number of children: Not on file  . Years of education: Not on file  . Highest education level: Not on file  Occupational History  . Occupation: homemaker  Tobacco Use  . Smoking status: Never Smoker  . Smokeless tobacco: Never Used  Substance and Sexual Activity  . Alcohol use: Never  . Drug use: Never  . Sexual activity: Yes    Partners: Male    Birth control/protection: None  Other Topics Concern  . Not on file  Social History Narrative  . Not on file   Social Determinants of Health   Financial Resource Strain:   . Difficulty of Paying Living Expenses: Not on file  Food Insecurity:   . Worried About Charity fundraiser in the Last Year: Not on file  . Ran Out of Food in the Last  Year: Not on file  Transportation Needs:   . Lack of Transportation (Medical): Not on file  . Lack of Transportation (Non-Medical): Not on file  Physical Activity:   . Days of Exercise per Week: Not on file  . Minutes of Exercise per Session: Not on file  Stress:   . Feeling of Stress : Not on file  Social Connections:   . Frequency of Communication with Friends and Family: Not on file  . Frequency of Social Gatherings with Friends and Family: Not on file  . Attends Religious Services: Not on file  . Active Member of Clubs or Organizations: Not on file  . Attends Archivist Meetings: Not on file  . Marital Status: Not on file    Family History: Family History  Problem Relation Age of Onset  . Kidney disease Maternal Grandfather        dialysis  . Heart disease Maternal Grandfather   . Thyroid disease Paternal Grandmother     Allergies: Allergies  Allergen Reactions  . Penicillins     Medications Prior to Admission  Medication Sig Dispense Refill Last Dose  . aspirin EC 81 MG tablet Take 1  tablet (81 mg total) by mouth daily. 30 tablet 8 05/28/2019 at 0715 time  . famotidine (PEPCID) 10 MG tablet Take 10 mg by mouth 2 (two) times daily.   05/27/2019 at Unknown time  . iron polysaccharides (NIFEREX) 150 MG capsule Take 1 capsule (150 mg total) by mouth daily. 30 capsule 6 05/28/2019 at Unknown time  . Prenatal Vit-Fe Fumarate-FA (MULTIVITAMIN-PRENATAL) 27-0.8 MG TABS tablet Take 1 tablet by mouth daily at 12 noon.   05/28/2019 at Unknown time     Review of Systems  All systems reviewed and negative except as stated in HPI  Physical Exam Last menstrual period 09/03/2018. General appearance: alert, oriented, NAD Lungs: normal respiratory effort Heart: regular rate Abdomen: soft, non-tender; gravid Extremities: No calf swelling or tenderness FHR: 165  Prenatal labs: ABO, Rh: A/RH(D) POSITIVE/-- (08/18 1527) Antibody: NO ANTIBODIES DETECTED (08/18 1527) Rubella:  1.23 (08/18 1527) RPR: NON-REACTIVE (01/08 0826)  HBsAg: NON-REACTIVE (08/18 1527)  HIV: NON-REACTIVE (01/08 0826)  GC/Chlamydia: neg/neg 05/15/2019  GBS:   Negative 05/15/2019 2-hr GTT: normal 04/05/2019 Genetic screening:  Low risk Panorama Anatomy US: normal  Prenatal Transfer Tool  Maternal Diabetes: No Genetic Screening: Normal Maternal Ultrasounds/Referrals: Normal Fetal Ultrasounds or other Referrals:  None Maternal Substance Abuse:  No Significant Maternal Medications:  None Significant Maternal Lab Results: Group B Strep negative  No results found for this or any previous visit (from the past 24 hour(s)).  Patient Active Problem List   Diagnosis Date Noted  . Cholestasis during pregnancy in third trimester 05/28/2019  . Anemia in pregnancy 04/08/2019  . Hx of cesarean section complicating pregnancy 11/13/2018  . History of gestational hypertension 11/13/2018  . Supervision of normal pregnancy, antepartum 11/05/2018    Assessment: Sammy Douthitt is a 29 y.o. [id number] at [redacted]w[redacted]d here for scheduled CS.  #RCS:  #Cholestasis of pregnancy: The risks of cesarean section discussed with the patient included but were not limited to: bleeding which may require transfusion or reoperation; infection which may require antibiotics; injury to bowel, bladder, ureters or other surrounding organs; injury to the fetus; need for additional procedures including hysterectomy in the event of a life-threatening hemorrhage; placental abnormalities with subsequent pregnancies, incisional problems, thromboembolic phenomenon and other postoperative/anesthesia complications. The patient concurred with the proposed plan, giving informed written consent for the procedure. Patient has been NPO since last night she will remain NPO for procedure. Anesthesia and OR aware. Preoperative prophylactic antibiotics and SCDs ordered on call to the OR. To OR when ready.   #Anesthesia: Spinal #FWB: FHR 165 #GBS/ID:  negative #COVID: swab pending #MOF: Breast #MOC: unsure #Circ: yes, inpatient   Venora Maples 05/28/2019, 1:57 PM

## 2019-05-28 NOTE — Transfer of Care (Signed)
Immediate Anesthesia Transfer of Care Note  Patient: Catherine Wu  Procedure(s) Performed: CESAREAN SECTION (N/A )  Patient Location: PACU  Anesthesia Type:Spinal  Level of Consciousness: awake, alert  and oriented  Airway & Oxygen Therapy: Patient Spontanous Breathing  Post-op Assessment: Report given to RN and Post -op Vital signs reviewed and stable  Post vital signs: Reviewed and stable  Last Vitals:  Vitals Value Taken Time  BP 120/66 05/28/19 1716  Temp    Pulse 102 05/28/19 1719  Resp 13 05/28/19 1719  SpO2 99 % 05/28/19 1719  Vitals shown include unvalidated device data.  Last Pain:  Vitals:   05/28/19 1416  TempSrc: Oral  PainSc: 0-No pain      Patients Stated Pain Goal: 5 (05/28/19 1416)  Complications: No apparent anesthesia complications

## 2019-05-29 ENCOUNTER — Encounter: Payer: Self-pay | Admitting: *Deleted

## 2019-05-29 LAB — CBC
HCT: 24.9 % — ABNORMAL LOW (ref 36.0–46.0)
HCT: 25.5 % — ABNORMAL LOW (ref 36.0–46.0)
Hemoglobin: 7.9 g/dL — ABNORMAL LOW (ref 12.0–15.0)
Hemoglobin: 8.2 g/dL — ABNORMAL LOW (ref 12.0–15.0)
MCH: 29.9 pg (ref 26.0–34.0)
MCH: 29.8 pg (ref 26.0–34.0)
MCHC: 31.7 g/dL (ref 30.0–36.0)
MCHC: 32.2 g/dL (ref 30.0–36.0)
MCV: 94.3 fL (ref 80.0–100.0)
MCV: 92.7 fL (ref 80.0–100.0)
Platelets: 154 10*3/uL (ref 150–400)
Platelets: 156 10*3/uL (ref 150–400)
RBC: 2.64 MIL/uL — ABNORMAL LOW (ref 3.87–5.11)
RBC: 2.75 MIL/uL — ABNORMAL LOW (ref 3.87–5.11)
RDW: 17.2 % — ABNORMAL HIGH (ref 11.5–15.5)
RDW: 17.5 % — ABNORMAL HIGH (ref 11.5–15.5)
WBC: 12.7 10*3/uL — ABNORMAL HIGH (ref 4.0–10.5)
WBC: 12 10*3/uL — ABNORMAL HIGH (ref 4.0–10.5)
nRBC: 0 % (ref 0.0–0.2)
nRBC: 0 % (ref 0.0–0.2)

## 2019-05-29 LAB — RPR: RPR Ser Ql: NONREACTIVE

## 2019-05-29 MED ORDER — SODIUM CHLORIDE 0.9 % IV SOLN
510.0000 mg | Freq: Once | INTRAVENOUS | Status: AC
  Administered 2019-05-29: 510 mg via INTRAVENOUS
  Filled 2019-05-29: qty 17

## 2019-05-29 MED ORDER — FERROUS SULFATE 325 (65 FE) MG PO TABS
325.0000 mg | ORAL_TABLET | ORAL | Status: DC
  Administered 2019-05-29: 325 mg via ORAL
  Filled 2019-05-29: qty 1

## 2019-05-29 NOTE — Lactation Note (Signed)
This note was copied from a baby's chart. Lactation Consultation Note Baby 13 hrs old. Mom stated BF hadn't been going that well. Mom stated she BF her other 2 children and they had tongue ties and feels this baby does as well. Mom stated baby isn't real interested in BF.  Mom stated she would like for LC to come back today and see her when baby is more interested and she feels better. RN stated mom is pretty sleepy.  Patient Name: Catherine Wu [id number]'E Date: 05/29/2019     Maternal Data    Feeding Feeding Type: Breast Fed  LATCH Score Latch: Too sleepy or reluctant, no latch achieved, no sucking elicited.  Audible Swallowing: None  Type of Nipple: Everted at rest and after stimulation  Comfort (Breast/Nipple): Soft / non-tender  Hold (Positioning): Assistance needed to correctly position infant at breast and maintain latch.  LATCH Score: 5  Interventions    Lactation Tools Discussed/Used     Consult Status      Shaunita Seney G 05/29/2019, 5:56 AM

## 2019-05-29 NOTE — Progress Notes (Addendum)
Post Partum Day 1 Subjective: voiding, tolerating PO and neg flatus, ambulating. Blurry vision of 1 hr duration last night with near objects. No headaches or vision changes following . Objective: Blood pressure 109/65, pulse 62, temperature 98.4 F (36.9 C), temperature source Oral, resp. rate 18, height 5\' 2"  (1.575 m), weight 91.6 kg, last menstrual period 09/03/2018, SpO2 99 %, unknown if currently breastfeeding.  Physical Exam:  General: alert and cooperative Lochia: appropriate Uterine Fundus: firm Incision: dressing dry clean and intact. DVT Evaluation: No cords or calf tenderness. No significant calf/ankle edema.  Recent Labs    05/28/19 1416 05/29/19 0541  HGB 12.5 7.9*  HCT 39.6 24.9*    Assessment/Plan: Breastfeeding and Contraception spermacide, redrawing CBC    LOS: 1 day   07/29/19 05/29/2019, 7:46 AM   GME ATTESTATION:  I saw and evaluated the patient. I agree with the findings and the plan of care as documented in the student's note.  07/29/2019, DO OB Fellow, Faculty [facility], Center for [facility] 05/30/2019 7:55 PM

## 2019-05-29 NOTE — Lactation Note (Signed)
This note was copied from a baby's chart. Lactation Consultation Note  Patient Name: Catherine Wu QQPYP'P Date: 05/29/2019 Reason for consult: Follow-up assessment;Early term 37-38.6wks;Infant weight loss;Other (Comment)(3 % weight loss - tongue mobility - midline anterior) Baby is 21 hours old , @ 13 hours - Bili 4.4  Mom requested for LC to check baby for a tongue- tie since her 1st 2 had them and she had challenges with latching. Per mom the tongue - ties tx were not done.  LC assessed baby's oral cavity with gloved fingers and noted upper lip stretched well,  And the labial frenulum was close to the gum line. The anterior frenulum notch was noted to be short and not tight,lateral movement noted both sides of tongue.  Baby doesn't stretch tongue far over gum line.  LC pointed these characteristics to mom and dad. Mom expressed she really wants to Breast feed this time and desires for it to go well.  Baby stirring in crib, LC offered to assess feeding and  Mom receptive at this time.  LC changed a wet and a small stool. Placed baby STS on the left breast / football / with firm support and reviewed basics of latching, importance of depth. After several; attempts baby latched with flanged lips and LC needed to ease chin down to increase  Depth. Baby fed for 10 mins with increased swallows with breast compressions.  Per mom comfortable with the latch and when baby released nipple well rounded.  LC noted semi compressible areolas with edema . Mom denies soreness.  LC plan -  Breast shells between feedings except when sleeping.  Prior to latching 1st breast - breast massage, hand express, pre-pump with the  Hand pump and reverse pressure , firm support and have dad help to ease chin for a deeper latch and to counteract the recessed chin.  LC reviewed the doc flow sheets with mom and dad and reassured then baby is off to a good start with numbers of feedings, voids and stools.  Per mom will  have 2 DEBP at home .  LC instructed mom on the use of shells and hand pump.  LC gave report to Nila Nephew and [facility] plan. LC will F.U tomorrow.   Maternal Data Has patient been taught Hand Expression?: Yes Does the patient have breastfeeding experience prior to this delivery?: Yes  Feeding Feeding Type: Breast Fed  LATCH Score Latch: Repeated attempts needed to sustain latch, nipple held in mouth throughout feeding, stimulation needed to elicit sucking reflex.  Audible Swallowing: Spontaneous and intermittent  Type of Nipple: Everted at rest and after stimulation  Comfort (Breast/Nipple): Soft / non-tender  Hold (Positioning): Assistance needed to correctly position infant at breast and maintain latch.  LATCH Score: 8  Interventions Interventions: Breast feeding basics reviewed;Assisted with latch;Skin to skin;Breast massage;Hand express;Reverse pressure;Breast compression;Adjust position;Support pillows;Position options;Shells;Hand pump  Lactation Tools Discussed/Used Tools: Shells;Pump Shell Type: Inverted Breast pump type: Manual WIC Program: No Pump Review: Setup, frequency, and cleaning Initiated by:: MAI Date initiated:: 05/29/19   Consult Status Consult Status: Follow-up Date: 05/30/19 Follow-up type: In-patient    Catherine Wu 05/29/2019, 2:20 PM

## 2019-05-29 NOTE — Lactation Note (Signed)
This note was copied from a baby's chart. Lactation Consultation Note Attempted to see mom but she was sleeping soundly.  Patient Name: Boy Caasi Giglia BDGRE'U Date: 05/29/2019     Maternal Data    Feeding Feeding Type: Breast Fed  LATCH Score Latch: Grasps breast easily, tongue down, lips flanged, rhythmical sucking.  Audible Swallowing: A few with stimulation  Type of Nipple: Everted at rest and after stimulation  Comfort (Breast/Nipple): Soft / non-tender  Hold (Positioning): Assistance needed to correctly position infant at breast and maintain latch.  LATCH Score: 8  Interventions Interventions: Assisted with latch;Skin to skin;Hand express;Breast massage;Support pillows;Adjust position  Lactation Tools Discussed/Used     Consult Status      Zaryan Yakubov, Diamond Nickel 05/29/2019, 1:56 AM

## 2019-05-30 ENCOUNTER — Encounter (HOSPITAL_COMMUNITY): Payer: Self-pay | Admitting: Family Medicine

## 2019-05-30 MED ORDER — IBUPROFEN 800 MG PO TABS: 800.0000 mg | ORAL_TABLET | Freq: Three times a day (TID) | ORAL | 0 refills | Status: DC

## 2019-05-30 MED ORDER — ACETAMINOPHEN 325 MG PO TABS: 650.0000 mg | ORAL_TABLET | Freq: Four times a day (QID) | ORAL | 0 refills | Status: DC | PRN

## 2019-05-30 MED ORDER — POLYETHYLENE GLYCOL 3350 17 G PO PACK: 17.0000 g | PACK | Freq: Every day | ORAL | 0 refills | Status: DC

## 2019-05-30 MED ORDER — OXYCODONE HCL 5 MG PO TABS: 5.0000 mg | ORAL_TABLET | ORAL | 0 refills | Status: DC | PRN

## 2019-05-30 MED ORDER — SENNOSIDES-DOCUSATE SODIUM 8.6-50 MG PO TABS: 2.0000 | ORAL_TABLET | ORAL | 0 refills | Status: DC

## 2019-05-30 NOTE — Lactation Note (Signed)
This note was copied from a baby's chart. Lactation Consultation Note  Patient Name: Catherine Wu [id number]'[id number] Date: 05/30/2019 Reason for consult: Follow-up assessment;Infant weight loss;Other (Comment);Early term 37-38.6wks(8 % weight loss)  Baby is 104 hours old  As LC entered the room mom getting ready to feed and prepumping ( 4 ml )  LC assisted to latch and was able to check the tongue mobility issue ,  More mobility this am , baby opens wide and is able to lift tongue upward but not above the corners of the mouth and LC suspects there is limited movement more posteriorly.  Baby latched with depth and occasionally pulls back and more shallow position latched ( mom aware to adjust at the latch). Use firm support deeper latch,moist heat above the baby while feeding on the breast to enhance let down.  Mom has a DEBP at home and is aware to add pot pumping after 4-5 feedings for 10 - 15 mins and feed milk back to baby.  Dr. Kennedy Bucker into see baby after the 2nd breast and recommended formula supplement due to the 8 % weight loss if EBM is not available.  Voids and stools correlate with 8 % weight loss.  Mom denies nipple soreness. Areolas noted to be more compressible today even though the shells are on the counter. LC reviewed the importance of the shells between feedings except when sleeping to enhance the compressibility of the areolas until breast feeding is going well.  Sore nipple and engorgement prevention and tx reviewed.  Mom has shells , hand pump from the hospital and her own DEBP at home.  LC recommended to check at her Pedis office for [facility] services and to consider having the baby's tongue mobility assessed. LC provided mom with Dr. Brock Ra in [place] due to closer location to her Pedis and home.  Mom has the [facility] pamphlet with phone numbers.    Maternal Data    Feeding Feeding Type: Breast Milk  LATCH Score Latch: Grasps breast easily, tongue down, lips flanged,  rhythmical sucking.  Audible Swallowing: Spontaneous and intermittent  Type of Nipple: Everted at rest and after stimulation  Comfort (Breast/Nipple): Soft / non-tender  Hold (Positioning): Assistance needed to correctly position infant at breast and maintain latch.  LATCH Score: 9  Interventions Interventions: Breast feeding basics reviewed;Assisted with latch;Skin to skin;Breast massage;Hand express;Reverse pressure;Breast compression;Adjust position;Support pillows;Position options  Lactation Tools Discussed/Used Tools: Shells;Pump Shell Type: Inverted Breast pump type: Manual Pump Review: Milk Storage   Consult Status Consult Status: Follow-up(see LC note) Follow-up type: Out-patient    [name] Tzippy Testerman 05/30/2019, 10:05 AM

## 2019-05-30 NOTE — Discharge Instructions (Signed)

## 2019-06-11 ENCOUNTER — Encounter: Payer: Self-pay | Admitting: Family Medicine

## 2019-06-11 ENCOUNTER — Other Ambulatory Visit: Payer: Self-pay

## 2019-06-11 ENCOUNTER — Ambulatory Visit (INDEPENDENT_AMBULATORY_CARE_PROVIDER_SITE_OTHER): Payer: BC Managed Care – PPO | Admitting: Family Medicine

## 2019-06-11 VITALS — BP 129/76 | HR 109 | Temp 98.5°F | Resp 16 | Ht 62.0 in | Wt 184.0 lb

## 2019-06-11 DIAGNOSIS — R309 Painful micturition, unspecified: Secondary | ICD-10-CM

## 2019-06-11 DIAGNOSIS — O34219 Maternal care for unspecified type scar from previous cesarean delivery: Secondary | ICD-10-CM

## 2019-06-11 LAB — POCT URINALYSIS DIPSTICK
Bilirubin, UA: NEGATIVE
Glucose, UA: NEGATIVE
Ketones, UA: NEGATIVE
Nitrite, UA: NEGATIVE
Protein, UA: NEGATIVE
Spec Grav, UA: 1.02 (ref 1.010–1.025)
Urobilinogen, UA: NEGATIVE E.U./dL — AB
pH, UA: 6.5 (ref 5.0–8.0)

## 2019-06-11 NOTE — Progress Notes (Signed)
   Subjective:    Patient ID: Catherine Wu is a 29 y.o. female presenting with 2 week incision check  on 06/11/2019  HPI: S/p 3rd repeat C-section 2 wks ago. No new complaints. Bleeding has increased a bit in last 3-4 days. Notes some clots when wiping. Reports bladder pain as well.  Review of Systems  Constitutional: Negative for chills and fever.  Respiratory: Negative for shortness of breath.   Cardiovascular: Negative for chest pain.  Gastrointestinal: Negative for abdominal pain, nausea and vomiting.  Genitourinary: Positive for vaginal bleeding. Negative for dysuria.  Skin: Negative for rash.      Objective:    BP 129/76   Pulse (!) 109   Temp 98.5 F (36.9 C)   Resp 16   Ht 5\' 2"  (1.575 m)   Wt 184 lb (83.5 kg)   LMP 09/03/2018   Breastfeeding Yes   BMI 33.65 kg/m  Physical Exam Constitutional:      General: She is not in acute distress.    Appearance: She is well-developed.  HENT:     Head: Normocephalic and atraumatic.  Eyes:     General: No scleral icterus. Cardiovascular:     Rate and Rhythm: Normal rate.  Pulmonary:     Effort: Pulmonary effort is normal.  Abdominal:     Palpations: Abdomen is soft.  Musculoskeletal:     Cervical back: Neck supple.  Skin:    General: Skin is warm and dry.     Comments: Steri-strips in place, incision is well healed.  Neurological:     Mental Status: She is alert and oriented to person, place, and time.    Urinalysis    Component Value Date/Time   BILIRUBINUR neg 06/11/2019 1406   PROTEINUR Negative 06/11/2019 1406   UROBILINOGEN negative (A) 06/11/2019 1406   NITRITE neg 06/11/2019 1406   LEUKOCYTESUR Moderate (2+) (A) 06/11/2019 1406         Assessment & Plan:  Painful urination - Will send for culture and treat as needed. - Plan: POCT Urinalysis Dipstick, Urine Culture  Hx of cesarean section complicating pregnancy - incision is healing well. Keep up with bleeding, if does not slow down let  06/13/2019 know.   Total time: 14 minutes.  Return for pp visit. [place] 06/11/2019 3:48 PM

## 2019-06-11 NOTE — Progress Notes (Signed)
Pt c/o's some pain with urination.  Dip is neg but culture sent

## 2019-06-13 LAB — URINE CULTURE
MICRO NUMBER:: 10257979
SPECIMEN QUALITY:: ADEQUATE

## 2019-06-13 MED ORDER — CEPHALEXIN 500 MG PO CAPS: 500.0000 mg | ORAL_CAPSULE | Freq: Four times a day (QID) | ORAL | 0 refills | Status: AC

## 2019-06-13 NOTE — Addendum Note (Signed)
Addended by: Reva Bores on: 06/13/2019 04:41 PM   Modules accepted: Orders

## 2019-07-09 ENCOUNTER — Telehealth (INDEPENDENT_AMBULATORY_CARE_PROVIDER_SITE_OTHER): Payer: BC Managed Care – PPO | Admitting: Certified Nurse Midwife

## 2019-07-09 ENCOUNTER — Encounter: Payer: Self-pay | Admitting: Certified Nurse Midwife

## 2019-07-09 MED ORDER — METOCLOPRAMIDE HCL 10 MG PO TABS: 10.0000 mg | ORAL_TABLET | Freq: Three times a day (TID) | ORAL | 0 refills | Status: DC | PRN

## 2019-07-09 NOTE — Progress Notes (Signed)
PPD Scale: score 4 Does not want any form of birth control

## 2019-07-09 NOTE — Progress Notes (Signed)
TELEHEALTH VIRTUAL POSTPARTUM VISIT ENCOUNTER NOTE  I connected with Catherine Wu on 07/09/19 at  2:12 PM EDT by MyChart video at home and verified that I am speaking with the correct person using two identifiers.   I discussed the limitations, risks, security and privacy concerns of performing an evaluation and management service by telephone and the availability of in person appointments. I also discussed with the patient that there may be a patient responsible charge related to this service. The patient expressed understanding and agreed to proceed.  Appointment Date: 07/09/2019  OBGYN Clinic: [facility]  Chief Complaint:  Postpartum Visit  History of Present Illness: Catherine Wu is a 29 y.o. Caucasian [id number], seen for the above chief complaint. Her past medical history is significant for Hx of cesarean section complicating pregnancy, hx of GHTN, and cholestasis during pregnancy.    She is s/p repeat cesarean section on 3/2 at 38.1 weeks; she was discharged to home on 3/4D#2. Pregnancy complicated by cholestasis and hx of GHTN. Baby is doing well.  No complaints at this time- patient reports can feel top of uterus above pubic symphysis, occasional pain with pushing but denies increased vaginal bleeding or fever.    Vaginal bleeding or discharge: No  Mode of feeding infant: Bottle and Breast, supplementing Catherine Wu  Intercourse: Yes  Contraception:vaginal contraceptive film  PP depression s/s: No .  Any bowel or bladder issues: No  Pap smear: no abnormalities (date: 05/15/2019)  Review of Systems: Her 12 point review of systems is negative or as noted in the History of Present Illness.  Patient Active Problem List   Diagnosis Date Noted  . Anemia in pregnancy 04/08/2019  . Hx of cesarean section complicating pregnancy 44/03/270  . History of gestational hypertension 11/13/2018  . Vitamin B12 deficiency (non anemic) 07/23/2017     Medications Catherine Wu had no medications administered during this visit. Current Outpatient Medications  Medication Sig Dispense Refill  . ibuprofen (ADVIL) 800 MG tablet Take 1 tablet (800 mg total) by mouth every 8 (eight) hours. (Patient not taking: Reported on 07/09/2019) 30 tablet 0  . polyethylene glycol (MIRALAX / GLYCOLAX) 17 g packet Take 17 g by mouth daily. (Patient not taking: Reported on 07/09/2019) 14 each 0  . Prenatal Vit-Fe Fumarate-FA (PRENATAL MULTIVITAMIN) TABS tablet Take 1 tablet by mouth daily at 12 noon.     No current facility-administered medications for this visit.    Allergies Penicillins  Physical Exam:  General:  Alert, oriented and cooperative.   Mental Status: Normal mood and affect perceived. Normal judgment and thought content.  Rest of physical exam deferred due to type of encounter  PP Depression Screening:   Edinburgh Postnatal Depression Scale - 07/09/19 1359      Edinburgh Postnatal Depression Scale:  In the Past 7 Days   I have been able to laugh and see the funny side of things.  0    I have looked forward with enjoyment to things.  0    I have blamed myself unnecessarily when things went wrong.  2    I have been anxious or worried for no good reason.  2    I have felt scared or panicky for no good reason.  0    Things have been getting on top of me.  0    I have been so unhappy that I have had difficulty sleeping.  0    I have felt sad or miserable.  0    I have been so unhappy that I have been crying.  0    The thought of harming myself has occurred to me.  0    Edinburgh Postnatal Depression Scale Total  4       Assessment:Patient is a 29 y.o. [id number] who is 6 weeks postpartum from a repeat cesarean section.  She is doing well.   Plan: 1. Postpartum care and examination - Normal postpartum examination  - patient doing well  - Patient reports that pediatrician recommended asking for Reglan Rx to help increase milk  production, Rx sent to pharmacy of choice  - Encouraged patient to call office and schedule appointment if vaginal bleeding returns with increasing abdominal pain, patient verbalizes understanding.   - metoCLOPramide (REGLAN) 10 MG tablet; Take 1 tablet (10 mg total) by mouth every 8 (eight) hours as needed.  Dispense: 30 tablet; Refill: 0  I discussed the assessment and treatment plan with the patient. The patient was provided an opportunity to ask questions and all were answered. The patient agreed with the plan and demonstrated an understanding of the instructions.   The patient was advised to call back or seek an in-person evaluation/go to the ED for any concerning postpartum symptoms.  I provided 11 minutes of non-face-to-face time during this encounter.   Sharyon Cable, CNM Center for [employer], [facility]

## 2019-07-14 ENCOUNTER — Encounter: Payer: Self-pay | Admitting: Certified Nurse Midwife

## 2021-03-01 ENCOUNTER — Encounter: Payer: Self-pay | Admitting: Obstetrics and Gynecology

## 2021-03-01 ENCOUNTER — Encounter: Payer: Self-pay | Admitting: *Deleted

## 2021-03-01 DIAGNOSIS — Z3480 Encounter for supervision of other normal pregnancy, unspecified trimester: Secondary | ICD-10-CM | POA: Insufficient documentation

## 2021-03-02 ENCOUNTER — Encounter: Payer: Self-pay | Admitting: Obstetrics and Gynecology

## 2021-03-04 ENCOUNTER — Encounter: Payer: Self-pay | Admitting: Obstetrics and Gynecology

## 2021-03-04 ENCOUNTER — Other Ambulatory Visit (HOSPITAL_COMMUNITY)
Admission: RE | Admit: 2021-03-04 | Discharge: 2021-03-04 | Disposition: A | Discharge status: Home / Self Care | Payer: BC Managed Care – PPO | Source: Ambulatory Visit | Attending: Obstetrics and Gynecology | Admitting: Obstetrics and Gynecology

## 2021-03-04 ENCOUNTER — Ambulatory Visit (INDEPENDENT_AMBULATORY_CARE_PROVIDER_SITE_OTHER): Payer: BC Managed Care – PPO | Admitting: Obstetrics and Gynecology

## 2021-03-04 ENCOUNTER — Other Ambulatory Visit: Payer: Self-pay

## 2021-03-04 VITALS — BP 131/79 | HR 99 | Wt 177.0 lb

## 2021-03-04 DIAGNOSIS — Z8759 Personal history of other complications of pregnancy, childbirth and the puerperium: Secondary | ICD-10-CM

## 2021-03-04 DIAGNOSIS — O34219 Maternal care for unspecified type scar from previous cesarean delivery: Secondary | ICD-10-CM | POA: Diagnosis not present

## 2021-03-04 DIAGNOSIS — Z3A10 10 weeks gestation of pregnancy: Secondary | ICD-10-CM

## 2021-03-04 DIAGNOSIS — Z8719 Personal history of other diseases of the digestive system: Secondary | ICD-10-CM | POA: Diagnosis not present

## 2021-03-04 DIAGNOSIS — Z3480 Encounter for supervision of other normal pregnancy, unspecified trimester: Secondary | ICD-10-CM | POA: Diagnosis not present

## 2021-03-04 NOTE — Progress Notes (Signed)
Bedside U/S shows single IUP with FHT of 168 BPM CRL measures 32.98mm  GA [redacted]w[redacted]d.  Pt declines Flu vaccine and genetic testing.

## 2021-03-04 NOTE — Progress Notes (Signed)
History:   Catherine Wu is a 30 y.o. [phone] at [redacted]w[redacted]d by L=10 being seen today for her first obstetrical visit.  Her obstetrical history is significant for obesity and pregnancy induced hypertension as well as cholestasis with her last pregnancy at 37w. Patient does intend to breast feed. Pregnancy history fully reviewed.  Patient reports no complaints.      HISTORY: OB History  Gravida Para Term Preterm AB Living  6 3 3  0 2 3  SAB IAB Ectopic Multiple Live Births  2 0 0 0 3    # Outcome Date GA Lbr Len/2nd Weight Sex Delivery Anes PTL Lv  6 Current           5 Term 05/28/19 [redacted]w[redacted]d  9 lb 3.4 oz (4.179 kg) M CS-LTranv Spinal  LIV     Name: Borges,BOY Angila     Apgar1: 8  Apgar5: 8  4 SAB           3 SAB           2 Term      CS-LTranv     1 Term      CS-LTranv       Last pap smear was done 04/2019 and was normal  Past Medical History:  Diagnosis Date   PONV (postoperative nausea and vomiting)    Past Surgical History:  Procedure Laterality Date   CESAREAN SECTION     CESAREAN SECTION N/A 05/28/2019   Procedure: CESAREAN SECTION;  Surgeon: 07/28/2019, DO;  Location: [facility] LD ORS;  Service: Obstetrics;  Laterality: N/A;   Family History  Problem Relation Age of Onset   Kidney disease Maternal Grandmother    Kidney disease Maternal Grandfather        dialysis   Heart disease Maternal Grandfather    Thyroid disease Paternal Grandmother    Social History   Tobacco Use   Smoking status: Never   Smokeless tobacco: Never  Vaping Use   Vaping Use: Never used  Substance Use Topics   Alcohol use: Never   Drug use: Never   Allergies  Allergen Reactions   Penicillins Other (See Comments)    Unknown childhood reaction. Did it involve swelling of the face/tongue/throat, SOB, or low BP? Unknown Did it involve sudden or severe rash/hives, skin peeling, or any reaction on the inside of your mouth or nose? Unknown Did you need to seek medical attention at a  hospital or doctor's office? Unknown When did it last happen?  Early childhood     If all above answers are "NO", may proceed with cephalosporin use.  Unknown childhood reaction. Did it involve swelling of the face/tongue/throat, SOB, or low BP? Unknown Did it involve sudden or severe rash/hives, skin peeling, or any reaction on the inside of your mouth or nose? Unknown Did you need to seek medical attention at a hospital or doctor's office? Unknown When did it last happen?  Early childhood     If all above answers are "NO", may proceed with cephalosporin use.   Current Outpatient Medications on File Prior to Visit  Medication Sig Dispense Refill   Prenatal Vit-Fe Fumarate-FA (PRENATAL MULTIVITAMIN) TABS tablet Take 1 tablet by mouth daily at 12 noon.     No current facility-administered medications on file prior to visit.    Review of Systems Pertinent items noted in HPI and remainder of comprehensive ROS otherwise negative.  Physical Exam:   Vitals:   03/04/21 1328  BP: 131/79  Pulse: 99  Weight: 177 lb (80.3 kg)   Fetal Heart Rate (bpm): 168 Bedside Ultrasound for FHR check: Viable intrauterine pregnancy with positive cardiac activity noted, fetal heart rate   Patient informed that the ultrasound is considered a limited obstetric ultrasound and is not intended to be a complete ultrasound exam.  Patient also informed that the ultrasound is not being completed with the intent of assessing for fetal or placental anomalies or any pelvic abnormalities.  Explained that the purpose of today's ultrasound is to assess for fetal heart rate.  Patient acknowledges the purpose of the exam and the limitations of the study. General: well-developed, well-nourished female in no acute distress  Breasts:  normal appearance, no masses or tenderness bilaterally  Skin: normal coloration and turgor, no rashes  Neurologic: oriented, normal, negative, normal mood  Extremities: normal strength, tone,  and muscle mass, ROM of all joints is normal  HEENT PERRLA, extraocular movement intact and sclera clear, anicteric  Neck supple and no masses  Cardiovascular: regular rate and rhythm  Respiratory:  no respiratory distress, normal breath sounds  Abdomen: soft, non-tender; bowel sounds normal; no masses,  no organomegaly  Pelvic: Deferred    Assessment:    Pregnancy: [id number] Patient Active Problem List   Diagnosis Date Noted   History of cholestasis during pregnancy 03/04/2021   Supervision of other normal pregnancy, antepartum 03/01/2021   Hx of cesarean section complicating pregnancy 11/13/2018   History of gestational hypertension 11/13/2018     Plan:    1. Supervision of other normal pregnancy, antepartum - Routine cultures done today - Declines flu shot  2. History of gestational hypertension - Start ldASA at 12 weeks - PreE labs added on today with routine labs  3. Hx of cesarean section complicating pregnancy - She has had 3 sections - will schedule for repeat mid/late second trimester bearing in mind will have to move up if cholestasis recurs.  - We discussed risk of PAS in s/o anterior placenta. - Plan for birth control is potentially vasectomy   Initial labs drawn. Continue prenatal vitamins. Problem list reviewed and updated. Genetic Screening discussed, First trimester screen, Quad screen, and NIPS: declined but will let us know if she changes her mind Ultrasound discussed; fetal anatomic survey: ordered. Anticipatory guidance about prenatal visits given including labs, ultrasounds, and testing. Discussed usage of Babyscripts and virtual visits as additional source of managing and completing prenatal visits in midst of coronavirus and pandemic.   Encouraged to complete MyChart Registration for her ability to review results, send requests, and have questions addressed.  The nature of [facility]Faculty Practice with multiple MDs  and Advanced Practice Providers was explained to patient; also emphasized that residents, students are part of our team. Routine obstetric precautions reviewed. Encouraged to seek out care at office or emergency room [facility] preferred) for urgent and/or emergent concerns. Return in about 4 weeks (around 04/01/2021) for OB VISIT, MD or APP.    Milas Hock, MD, FACOG Obstetrician & Gynecologist, [facility] for [facility], [facility]

## 2021-03-05 LAB — GC/CHLAMYDIA PROBE AMP (~~LOC~~) NOT AT ARMC
Chlamydia: NEGATIVE
Comment: NEGATIVE
Comment: NORMAL
Neisseria Gonorrhea: NEGATIVE

## 2021-03-06 LAB — URINE CULTURE, OB REFLEX

## 2021-03-06 LAB — CULTURE, OB URINE

## 2021-03-08 LAB — COMPREHENSIVE METABOLIC PANEL
AG Ratio: 1.7 (calc) (ref 1.0–2.5)
ALT: 13 U/L (ref 6–29)
AST: 13 U/L (ref 10–30)
Albumin: 4 g/dL (ref 3.6–5.1)
Alkaline phosphatase (APISO): 55 U/L (ref 31–125)
BUN: 7 mg/dL (ref 7–25)
CO2: 21 mmol/L (ref 20–32)
Calcium: 8.4 mg/dL — ABNORMAL LOW (ref 8.6–10.2)
Chloride: 105 mmol/L (ref 98–110)
Creat: 0.55 mg/dL (ref 0.50–0.96)
Globulin: 2.3 g/dL (calc) (ref 1.9–3.7)
Glucose, Bld: 90 mg/dL (ref 65–99)
Potassium: 4.1 mmol/L (ref 3.5–5.3)
Sodium: 138 mmol/L (ref 135–146)
Total Bilirubin: 0.6 mg/dL (ref 0.2–1.2)
Total Protein: 6.3 g/dL (ref 6.1–8.1)

## 2021-03-08 LAB — OBSTETRIC PANEL
Absolute Monocytes: 439 cells/uL (ref 200–950)
Antibody Screen: NOT DETECTED
Basophils Absolute: 7 cells/uL (ref 0–200)
Basophils Relative: 0.1 %
Eosinophils Absolute: 58 cells/uL (ref 15–500)
Eosinophils Relative: 0.8 %
HCT: 35 % (ref 35.0–45.0)
Hemoglobin: 11.6 g/dL — ABNORMAL LOW (ref 11.7–15.5)
Hepatitis B Surface Ag: NONREACTIVE
Lymphs Abs: 1526 cells/uL (ref 850–3900)
MCH: 30.8 pg (ref 27.0–33.0)
MCHC: 33.1 g/dL (ref 32.0–36.0)
MCV: 92.8 fL (ref 80.0–100.0)
MPV: 11.9 fL (ref 7.5–12.5)
Monocytes Relative: 6.1 %
Neutro Abs: 5170 cells/uL (ref 1500–7800)
Neutrophils Relative %: 71.8 %
Platelets: 240 10*3/uL (ref 140–400)
RBC: 3.77 10*6/uL — ABNORMAL LOW (ref 3.80–5.10)
RDW: 12.8 % (ref 11.0–15.0)
RPR Ser Ql: NONREACTIVE
Rubella: 0.99 Index — ABNORMAL LOW
Total Lymphocyte: 21.2 %
WBC: 7.2 10*3/uL (ref 3.8–10.8)

## 2021-03-08 LAB — HEPATITIS C ANTIBODY
Hepatitis C Ab: NONREACTIVE
SIGNAL TO CUT-OFF: 0.02 (ref <1.00)

## 2021-03-08 LAB — PROTEIN / CREATININE RATIO, URINE
Creatinine, Urine: 88 mg/dL (ref 20–275)
Protein/Creat Ratio: 80 mg/g creat (ref 24–184)
Protein/Creatinine Ratio: 0.08 mg/mg creat (ref 0.024–0.184)
Total Protein, Urine: 7 mg/dL (ref 5–24)

## 2021-03-08 LAB — HIV ANTIBODY (ROUTINE TESTING W REFLEX): HIV 1&2 Ab, 4th Generation: NONREACTIVE

## 2021-04-02 ENCOUNTER — Other Ambulatory Visit: Payer: Self-pay

## 2021-04-02 ENCOUNTER — Ambulatory Visit (INDEPENDENT_AMBULATORY_CARE_PROVIDER_SITE_OTHER): Payer: BC Managed Care – PPO | Admitting: Obstetrics and Gynecology

## 2021-04-02 DIAGNOSIS — Z3480 Encounter for supervision of other normal pregnancy, unspecified trimester: Secondary | ICD-10-CM

## 2021-04-02 MED ORDER — ASPIRIN EC 81 MG PO TBEC: 81.0000 mg | DELAYED_RELEASE_TABLET | Freq: Every day | ORAL | 11 refills | Status: DC

## 2021-04-02 NOTE — Progress Notes (Signed)
° °  PRENATAL VISIT NOTE  Subjective:  Catherine Wu is a 31 y.o. [phone] at [redacted]w[redacted]d being seen today for ongoing prenatal care.  She is currently monitored for the following issues for this low-risk pregnancy and has Hx of cesarean section complicating pregnancy; History of gestational hypertension; Supervision of other normal pregnancy, antepartum; and History of cholestasis during pregnancy on their problem list.  Patient reports no complaints.  Contractions: Not present. Vag. Bleeding: None.  Movement: Absent. Denies leaking of fluid.   The following portions of the patient's history were reviewed and updated as appropriate: allergies, current medications, past family history, past medical history, past social history, past surgical history and problem list.   Objective:   Vitals:   04/02/21 1012  BP: 112/70  Pulse: 76  Weight: 179 lb (81.2 kg)    Fetal Status: Fetal Heart Rate (bpm): 147   Movement: Absent     General:  Alert, oriented and cooperative. Patient is in no acute distress.  Skin: Skin is warm and dry. No rash noted.   Cardiovascular: Normal heart rate noted  Respiratory: Normal respiratory effort, no problems with respiration noted  Abdomen: Soft, gravid, appropriate for gestational age.  Pain/Pressure: Absent     Pelvic: Cervical exam deferred        Extremities: Normal range of motion.  Edema: None  Mental Status: Normal mood and affect. Normal behavior. Normal judgment and thought content.   Assessment and Plan:  Pregnancy: [id number] at [redacted]w[redacted]d  1. Supervision of other normal pregnancy, antepartum  Doing well Will start BASA today Discussed nutrition and recommended weight gain  Declined NIPS, declined AFP   Preterm labor symptoms and general obstetric precautions including but not limited to vaginal bleeding, contractions, leaking of fluid and fetal movement were reviewed in detail with the patient. Please refer to After Visit Summary for other  counseling recommendations.   No follow-ups on file.  Future Appointments  Date Time Provider Department Center  04/29/2021 10:10 AM Milas Hock, MD [facility]  05/10/2021 10:45 AM [facility] US5 [facility]    Venia Carbon, NP

## 2021-04-19 ENCOUNTER — Encounter: Payer: Self-pay | Admitting: Obstetrics and Gynecology

## 2021-04-19 ENCOUNTER — Other Ambulatory Visit: Payer: Self-pay | Admitting: *Deleted

## 2021-04-19 MED ORDER — ASPIRIN EC 81 MG PO TBEC: 81.0000 mg | DELAYED_RELEASE_TABLET | Freq: Every day | ORAL | 11 refills | Status: DC

## 2021-04-27 NOTE — Progress Notes (Signed)
° °  PRENATAL VISIT NOTE  Subjective:  Catherine Wu is a 31 y.o. [phone] at 59w4dbeing seen today for ongoing prenatal care.  She is currently monitored for the following issues for this high-risk pregnancy and has Hx of cesarean section complicating pregnancy; History of gestational hypertension; Supervision of other normal pregnancy, antepartum; and History of cholestasis during pregnancy on their problem list.  Patient reports no complaints.  Contractions: Not present. Vag. Bleeding: None.  Movement: Present. Denies leaking of fluid.   The following portions of the patient's history were reviewed and updated as appropriate: allergies, current medications, past family history, past medical history, past social history, past surgical history and problem list.   Objective:   Vitals:   04/29/21 1018  BP: 124/68  Pulse: 86  Weight: 180 lb (81.6 kg)    Fetal Status: Fetal Heart Rate (bpm): 147   Movement: Present     General:  Alert, oriented and cooperative. Patient is in no acute distress.  Skin: Skin is warm and dry. No rash noted.   Cardiovascular: Normal heart rate noted  Respiratory: Normal respiratory effort, no problems with respiration noted  Abdomen: Soft, gravid, appropriate for gestational age.  Pain/Pressure: Absent     Pelvic: Cervical exam deferred        Extremities: Normal range of motion.  Edema: None  Mental Status: Normal mood and affect. Normal behavior. Normal judgment and thought content.   Assessment and Plan:  Pregnancy: [id number] [redacted]w[redacted]d. Hx of cesarean section complicating pregnancy - Schedule repeat at 28w appointment for 39w bearing in mind h/o cholestasis and recurrence risk  2. History of gestational hypertension - Reviewed ldASA - she is taking  3. Supervision of other normal pregnancy, antepartum - Declined AFP - Reviewed Rubella equivocal and indication for MMR after delivery - Anatomy USKoreacheduled for 2/13  4. History of cholestasis  during pregnancy   Preterm labor symptoms and general obstetric precautions including but not limited to vaginal bleeding, contractions, leaking of fluid and fetal movement were reviewed in detail with the patient. Please refer to After Visit Summary for other counseling recommendations.   No follow-ups on file.  Future Appointments  Date Time Provider De[place]len Burnie2/13/2023 10:45 AM [facility]  PaRadene GunningMD

## 2021-04-29 ENCOUNTER — Ambulatory Visit (INDEPENDENT_AMBULATORY_CARE_PROVIDER_SITE_OTHER): Payer: BC Managed Care – PPO | Admitting: Obstetrics and Gynecology

## 2021-04-29 ENCOUNTER — Encounter: Payer: Self-pay | Admitting: Obstetrics and Gynecology

## 2021-04-29 ENCOUNTER — Other Ambulatory Visit: Payer: Self-pay

## 2021-04-29 VITALS — BP 124/68 | HR 86 | Wt 180.0 lb

## 2021-04-29 DIAGNOSIS — O34219 Maternal care for unspecified type scar from previous cesarean delivery: Secondary | ICD-10-CM

## 2021-04-29 DIAGNOSIS — Z8719 Personal history of other diseases of the digestive system: Secondary | ICD-10-CM

## 2021-04-29 DIAGNOSIS — Z3480 Encounter for supervision of other normal pregnancy, unspecified trimester: Secondary | ICD-10-CM

## 2021-04-29 DIAGNOSIS — Z8759 Personal history of other complications of pregnancy, childbirth and the puerperium: Secondary | ICD-10-CM

## 2021-05-10 ENCOUNTER — Other Ambulatory Visit: Payer: Self-pay | Admitting: *Deleted

## 2021-05-10 ENCOUNTER — Other Ambulatory Visit: Payer: Self-pay | Admitting: Obstetrics and Gynecology

## 2021-05-10 ENCOUNTER — Other Ambulatory Visit: Payer: Self-pay

## 2021-05-10 ENCOUNTER — Ambulatory Visit: Payer: BC Managed Care – PPO | Attending: Obstetrics and Gynecology

## 2021-05-10 DIAGNOSIS — Z3480 Encounter for supervision of other normal pregnancy, unspecified trimester: Secondary | ICD-10-CM

## 2021-05-10 DIAGNOSIS — O34219 Maternal care for unspecified type scar from previous cesarean delivery: Secondary | ICD-10-CM | POA: Diagnosis not present

## 2021-05-10 DIAGNOSIS — O09299 Supervision of pregnancy with other poor reproductive or obstetric history, unspecified trimester: Secondary | ICD-10-CM

## 2021-05-10 DIAGNOSIS — O09292 Supervision of pregnancy with other poor reproductive or obstetric history, second trimester: Secondary | ICD-10-CM | POA: Insufficient documentation

## 2021-05-10 DIAGNOSIS — Z3A20 20 weeks gestation of pregnancy: Secondary | ICD-10-CM | POA: Diagnosis not present

## 2021-05-10 DIAGNOSIS — Z8719 Personal history of other diseases of the digestive system: Secondary | ICD-10-CM

## 2021-05-10 DIAGNOSIS — O358XX0 Maternal care for other (suspected) fetal abnormality and damage, not applicable or unspecified: Secondary | ICD-10-CM | POA: Diagnosis not present

## 2021-05-10 DIAGNOSIS — Z8759 Personal history of other complications of pregnancy, childbirth and the puerperium: Secondary | ICD-10-CM

## 2021-05-10 DIAGNOSIS — Z363 Encounter for antenatal screening for malformations: Secondary | ICD-10-CM | POA: Diagnosis not present

## 2021-05-25 ENCOUNTER — Ambulatory Visit (INDEPENDENT_AMBULATORY_CARE_PROVIDER_SITE_OTHER): Payer: BC Managed Care – PPO | Admitting: Obstetrics and Gynecology

## 2021-05-25 ENCOUNTER — Encounter: Payer: Self-pay | Admitting: Obstetrics and Gynecology

## 2021-05-25 ENCOUNTER — Other Ambulatory Visit: Payer: Self-pay

## 2021-05-25 VITALS — BP 128/72 | HR 91 | Wt 183.0 lb

## 2021-05-25 DIAGNOSIS — O34219 Maternal care for unspecified type scar from previous cesarean delivery: Secondary | ICD-10-CM

## 2021-05-25 DIAGNOSIS — Z8759 Personal history of other complications of pregnancy, childbirth and the puerperium: Secondary | ICD-10-CM

## 2021-05-25 DIAGNOSIS — Z3480 Encounter for supervision of other normal pregnancy, unspecified trimester: Secondary | ICD-10-CM

## 2021-05-25 NOTE — Progress Notes (Signed)
° °  PRENATAL VISIT NOTE  Subjective:  Catherine Wu is a 31 y.o. [phone] at [redacted]w[redacted]d being seen today for ongoing prenatal care.  She is currently monitored for the following issues for this high-risk pregnancy and has Hx of cesarean section complicating pregnancy; History of gestational hypertension; Supervision of other normal pregnancy, antepartum; and History of cholestasis during pregnancy on their problem list.  Patient reports no complaints.  Contractions: Not present. Vag. Bleeding: None.  Movement: Present. Denies leaking of fluid.   The following portions of the patient's history were reviewed and updated as appropriate: allergies, current medications, past family history, past medical history, past social history, past surgical history and problem list.   Objective:   Vitals:   05/25/21 0932  BP: 128/72  Pulse: 91  Weight: 183 lb (83 kg)    Fetal Status: Fetal Heart Rate (bpm): 151 Fundal Height: 22 cm Movement: Present     General:  Alert, oriented and cooperative. Patient is in no acute distress.  Skin: Skin is warm and dry. No rash noted.   Cardiovascular: Normal heart rate noted  Respiratory: Normal respiratory effort, no problems with respiration noted  Abdomen: Soft, gravid, appropriate for gestational age.  Pain/Pressure: Absent     Pelvic: Cervical exam deferred        Extremities: Normal range of motion.  Edema: None  Mental Status: Normal mood and affect. Normal behavior. Normal judgment and thought content.   Assessment and Plan:  Pregnancy: [id number] at [redacted]w[redacted]d 1. Supervision of other normal pregnancy, antepartum Patient is doing well without complaints Third trimester labs with glucola next visit Follow up anatomy scheduled in May  2. Hx of cesarean section complicating pregnancy Will be scheduled for her 4th at next appointment  3. History of gestational hypertension Normotensive Continue ASA  Preterm labor symptoms and general obstetric  precautions including but not limited to vaginal bleeding, contractions, leaking of fluid and fetal movement were reviewed in detail with the patient. Please refer to After Visit Summary for other counseling recommendations.   Return in about 4 weeks (around 06/22/2021) for in person, ROB, Low risk, 2 hr glucola next visit.  Future Appointments  Date Time Provider [place]  08/02/2021  1:15 PM [facility] NURSE [facility]  08/02/2021  1:30 PM [facility] US2 [facility]    Mora Bellman, MD

## 2021-06-22 ENCOUNTER — Other Ambulatory Visit: Payer: Self-pay

## 2021-06-22 ENCOUNTER — Ambulatory Visit (INDEPENDENT_AMBULATORY_CARE_PROVIDER_SITE_OTHER): Payer: BC Managed Care – PPO | Admitting: Obstetrics and Gynecology

## 2021-06-22 VITALS — BP 123/76 | HR 90 | Wt 185.0 lb

## 2021-06-22 DIAGNOSIS — Z3480 Encounter for supervision of other normal pregnancy, unspecified trimester: Secondary | ICD-10-CM

## 2021-06-22 DIAGNOSIS — O26899 Other specified pregnancy related conditions, unspecified trimester: Secondary | ICD-10-CM

## 2021-06-22 DIAGNOSIS — R102 Pelvic and perineal pain: Secondary | ICD-10-CM

## 2021-06-22 NOTE — Progress Notes (Signed)
? ?  PRENATAL VISIT NOTE ? ?Subjective:  ?Catherine Wu is a 31 y.o. [phone] at [redacted]w[redacted]d being seen today for ongoing prenatal care.  She is currently monitored for the following issues for this high-risk pregnancy and has Hx of cesarean section complicating pregnancy; History of gestational hypertension; Supervision of other normal pregnancy, antepartum; and History of cholestasis during pregnancy on their problem list. ? ?Patient reports no complaints.  Contractions: Not present. Vag. Bleeding: None.  Movement: Present. Denies leaking of fluid. Pelvic pain. Getting out of the shower and bed is painful.  ? ?The following portions of the patient's history were reviewed and updated as appropriate: allergies, current medications, past family history, past medical history, past social history, past surgical history and problem list.  ? ?Objective:  ? ?Vitals:  ? 06/22/21 0846  ?BP: 123/76  ?Pulse: 90  ?Weight: 185 lb (83.9 kg)  ? ? ?Fetal Status: Fetal Heart Rate (bpm): 143   Movement: Present    ? ?General:  Alert, oriented and cooperative. Patient is in no acute distress.  ?Skin: Skin is warm and dry. No rash noted.   ?Cardiovascular: Normal heart rate noted  ?Respiratory: Normal respiratory effort, no problems with respiration noted  ?Abdomen: Soft, gravid, appropriate for gestational age.  Pain/Pressure: Absent     ?Pelvic: Cervical exam deferred        ?Extremities: Normal range of motion.  Edema: Trace  ?Mental Status: Normal mood and affect. Normal behavior. Normal judgment and thought content.  ? ?Assessment and Plan:  ?Pregnancy: [id number] at [redacted]w[redacted]d ?1. Supervision of other normal pregnancy, antepartum ? ?- 2Hr GTT w/ 1 Hr Carpenter 75 g ?- HIV antibody (with reflex) ?- CBC ?- RPR ?- continue BASA daily  ?- Message sent to get repeat C/s scheduled for 39 weeks.  ?- TDAP next visit per patient request ? ?2. Pelvic pain in pregnancy ? ?- Ambulatory referral to Physical Therapy  ?- had this with previous  pregnancy.  ?- Pregnancy support belt encouraged  ? ?Preterm labor symptoms and general obstetric precautions including but not limited to vaginal bleeding, contractions, leaking of fluid and fetal movement were reviewed in detail with the patient. ?Please refer to After Visit Summary for other counseling recommendations.  ? ?No follow-ups on file. ? ?Future Appointments  ?Date Time Provider [place]  ?07/13/2021 10:30 AM Left[place]-Kirby, Kathie Dike, CNM [facility]  ?08/02/2021  1:15 PM [facility] NURSE [facility]  ?08/02/2021  1:30 PM [facility] US2 [facility]  ? ? ?Noni Saupe, NP  ?

## 2021-06-23 LAB — HIV ANTIBODY (ROUTINE TESTING W REFLEX): HIV 1&2 Ab, 4th Generation: NONREACTIVE

## 2021-06-23 LAB — 2HR GTT W 1 HR, CARPENTER, 75 G
Glucose, 1 Hr, Gest: 140 mg/dL (ref 65–179)
Glucose, 2 Hr, Gest: 80 mg/dL (ref 65–152)
Glucose, Fasting, Gest: 94 mg/dL — ABNORMAL HIGH (ref 65–91)

## 2021-06-23 LAB — CBC
HCT: 30.9 % — ABNORMAL LOW (ref 35.0–45.0)
Hemoglobin: 10.4 g/dL — ABNORMAL LOW (ref 11.7–15.5)
MCH: 30.9 pg (ref 27.0–33.0)
MCHC: 33.7 g/dL (ref 32.0–36.0)
MCV: 91.7 fL (ref 80.0–100.0)
MPV: 10.6 fL (ref 7.5–12.5)
Platelets: 262 10*3/uL (ref 140–400)
RBC: 3.37 10*6/uL — ABNORMAL LOW (ref 3.80–5.10)
RDW: 12.8 % (ref 11.0–15.0)
WBC: 8.8 10*3/uL (ref 3.8–10.8)

## 2021-06-23 LAB — RPR: RPR Ser Ql: NONREACTIVE

## 2021-06-24 ENCOUNTER — Other Ambulatory Visit: Payer: Self-pay | Admitting: Obstetrics and Gynecology

## 2021-06-24 ENCOUNTER — Encounter: Payer: Self-pay | Admitting: Obstetrics and Gynecology

## 2021-06-24 DIAGNOSIS — Z8632 Personal history of gestational diabetes: Secondary | ICD-10-CM | POA: Insufficient documentation

## 2021-06-24 DIAGNOSIS — O24410 Gestational diabetes mellitus in pregnancy, diet controlled: Secondary | ICD-10-CM

## 2021-06-24 DIAGNOSIS — O24419 Gestational diabetes mellitus in pregnancy, unspecified control: Secondary | ICD-10-CM | POA: Insufficient documentation

## 2021-06-30 ENCOUNTER — Encounter: Payer: BC Managed Care – PPO | Attending: Obstetrics and Gynecology | Admitting: Registered"

## 2021-06-30 ENCOUNTER — Encounter: Payer: Self-pay | Admitting: Registered"

## 2021-06-30 DIAGNOSIS — O24410 Gestational diabetes mellitus in pregnancy, diet controlled: Secondary | ICD-10-CM | POA: Diagnosis present

## 2021-06-30 DIAGNOSIS — O24419 Gestational diabetes mellitus in pregnancy, unspecified control: Secondary | ICD-10-CM

## 2021-06-30 NOTE — Progress Notes (Signed)
Patient was seen on 06/30/21 for Gestational Diabetes self-management class at the [facility]. The following learning objectives were met by the patient during this course: ? ?States the definition of Gestational Diabetes ?States why dietary management is important in controlling blood glucose ?Describes the effects each nutrient has on blood glucose levels ?Demonstrates ability to create a balanced meal plan ?Demonstrates carbohydrate counting  ?States when to check blood glucose levels ?Demonstrates proper blood glucose monitoring techniques ?States the effect of stress and exercise on blood glucose levels ?States the importance of limiting caffeine and abstaining from alcohol and smoking ? ?Blood glucose monitor given: Contour Next EZ ?Lot # [id number] ?Exp: 10/25/21 ?Blood glucose reading: 93 mg/dL ? ?Patient instructed to monitor glucose levels: ?FBS: 60 - <95; 1 hour: <140; 2 hour: <120 ? ?Patient received handouts: ?Nutrition Diabetes and Pregnancy, including carb counting list ? ?Patient will be seen for follow-up as needed. ?

## 2021-07-01 MED ORDER — LANCETS MISC: 1.0000 | Freq: Four times a day (QID) | 3 refills | Status: DC

## 2021-07-01 MED ORDER — CONTOUR NEXT TEST VI STRP: ORAL_STRIP | 3 refills | Status: DC

## 2021-07-01 NOTE — Addendum Note (Signed)
Addended by: Lyndal Rainbow on: 07/01/2021 03:34 PM ? ? Modules accepted: Orders ? ?

## 2021-07-07 ENCOUNTER — Ambulatory Visit: Payer: BC Managed Care – PPO | Attending: Obstetrics and Gynecology | Admitting: Physical Therapy

## 2021-07-07 ENCOUNTER — Encounter: Payer: Self-pay | Admitting: Physical Therapy

## 2021-07-07 DIAGNOSIS — M62838 Other muscle spasm: Secondary | ICD-10-CM | POA: Diagnosis not present

## 2021-07-07 DIAGNOSIS — O26899 Other specified pregnancy related conditions, unspecified trimester: Secondary | ICD-10-CM | POA: Insufficient documentation

## 2021-07-07 DIAGNOSIS — R102 Pelvic and perineal pain: Secondary | ICD-10-CM | POA: Diagnosis not present

## 2021-07-07 DIAGNOSIS — R293 Abnormal posture: Secondary | ICD-10-CM | POA: Insufficient documentation

## 2021-07-07 DIAGNOSIS — M6281 Muscle weakness (generalized): Secondary | ICD-10-CM | POA: Diagnosis not present

## 2021-07-07 DIAGNOSIS — R279 Unspecified lack of coordination: Secondary | ICD-10-CM | POA: Diagnosis not present

## 2021-07-07 DIAGNOSIS — Z3A00 Weeks of gestation of pregnancy not specified: Secondary | ICD-10-CM | POA: Diagnosis not present

## 2021-07-07 NOTE — Therapy (Signed)
?OUTPATIENT PHYSICAL THERAPY FEMALE PELVIC EVALUATION ? ? ?Patient Name: Catherine Wu ?MRN: [id number] ?DOB:05/11/1990, 31 y.o., female ?Today's Date: 07/07/2021 ? ? PT End of Session - 07/07/21 1138   ? ? Visit Number 1   ? Date for PT Re-Evaluation 10/06/21   ? Authorization Type BCBS   ? PT Start Time 1107   ? PT Stop Time 1140   ? PT Time Calculation (min) 33 min   ? Activity Tolerance Patient tolerated treatment well   ? Behavior During Therapy [facility] for tasks assessed/performed   ? ?  ?  ? ?  ? ? ?Past Medical History:  ?Diagnosis Date  ? PONV (postoperative nausea and vomiting)   ? ?Past Surgical History:  ?Procedure Laterality Date  ? CESAREAN SECTION    ? CESAREAN SECTION N/A 05/28/2019  ? Procedure: CESAREAN SECTION;  Surgeon: Truett Mainland, DO;  Location: [facility];  Service: Obstetrics;  Laterality: N/A;  ? ?Patient Active Problem List  ? Diagnosis Date Noted  ? Gestational diabetes 06/24/2021  ? History of cholestasis during pregnancy 03/04/2021  ? Supervision of other normal pregnancy, antepartum 03/01/2021  ? Hx of cesarean section complicating pregnancy [id number]  ? History of gestational hypertension 11/13/2018  ? ? ?PCP: Patient, No Pcp Per (Inactive) ? ?REFERRING PROVIDER: Rasch, Artist Pais, NP ? ?REFERRING DIAG: O26.899,R10.2 (ICD-10-CM) - Pelvic pain in pregnancy ? ?THERAPY DIAG:  ?Pelvic pain in pregnancy ? ?ONSET DATE: couple weeks ? ?SUBJECTIVE:                                                                                                                                                                                          ? ?SUBJECTIVE STATEMENT: ?Pt reports pelvic pain at anterior mid pelvis, worse with more activity during the day. Will have pain with rolling in bed, single leg activities.  ?Fluid intake: Yes: half gallon to 1/4 gallon   ? ?Patient confirms identification and approves PT to assess pelvic floor and treatment Yes ? ? ?PAIN:  ?Are you having pain? Yes ?NPRS  scale: 5/10 ?Pain location:  anterior pelvis ? ?Pain type: sharp ?Pain description: intermittent  ? ?Aggravating factors: being on feet a lot during the day ?Relieving factors: rest ? ?PRECAUTIONS: Other: pregnant ? ?WEIGHT BEARING RESTRICTIONS No ? ?FALLS:  ?Has patient fallen in last 6 months? No ? ?LIVING ENVIRONMENT: ?Lives with: lives with their family ?Lives in: House/apartment ? ? ?OCCUPATION: photographer part time ? ?PLOF: Independent ? ?PATIENT GOALS to have less pain, less urine leakage  ? ?PERTINENT HISTORY:  ?[id number], HIV antibody, high-risk pregnancy,  Hx of cesarean section complicating pregnancy; History  of gestational hypertension; Supervision of other normal pregnancy, antepartum; and History of cholestasis during pregnancy on their problem list. ?Sexual abuse: No ? ?BOWEL MOVEMENT ?Pain with bowel movement: No ?Type of bowel movement:Type (Bristol Stool Scale) 4, Frequency 1-2x per day, and Strain Yes ?Fully empty rectum: No ?Leakage: No ?Pads: No ?Fiber supplement: No ? ?URINATION ?Pain with urination: No ?Fully empty bladder: Yes:   ?Stream: Strong ?Urgency: No ?Frequency: worsening with pregnancy but prior to pregnancy had no concerns with this  ?Leakage: Coughing, Sneezing, Laughing, Lifting, Bending forward, and Intercourse ?Pads: No ? ?INTERCOURSE ?Pain with intercourse:  none  ?Ability to have vaginal penetration:  Yes:   ?Climax: No pain ?Marinoff Scale: 0/3 ? ?PREGNANCY ?Vaginal deliveries 0 ?Tearing No ?C-section deliveries 3 ?Currently pregnant Yes: 28 weeks ? ?PROLAPSE ?Feels heaviness only when sitting on toliet for BMs or urination ? ? ? ?OBJECTIVE:  ? ?DIAGNOSTIC FINDINGS:  ? ? ?COGNITION: ? Overall cognitive status: Within functional limits for tasks assessed   ?  ?SENSATION: ? Light touch: Appears intact ? Proprioception: Appears intact ? ?MUSCLE LENGTH: ?Bil hamstrings and adductors limited by 25% ? ? ? ?FUNCTIONAL TESTS:  ?Singe leg stance (-) for pain today however did have hip  drop noted when standing on Rt leg drop on Lt. Pt does report when she is having pain this is much more difficult however today is not having pain.  ? ? ?POSTURE:  ?Rounded shoulders, anterior pelvic tilt ? ?LUMBARAROM/PROM ? ?WFL however does report and demonstrate stiffness in bil rotation and side bending ? ?LE ROM: ?Bil WFL ? ?LE MMT: ? ?Bil hip abduction 3+/5; all other 4/5 ? ?PELVIC MMT: deferred at this time ?  ?MMT  ?07/07/2021  ?Vaginal   ?Internal Anal Sphincter   ?External Anal Sphincter   ?Puborectalis   ?Diastasis Recti   ?(Blank rows = not tested) ? ?      PALPATION: ?  General  mild TTP at pubic symphysis ? ?              External Perineal Exam deferred ?              ?              Internal Pelvic Floor deferred ? ?TONE: ?deferred ? ?PROLAPSE: ?deferred ? ?TODAY'S TREATMENT  ?EVAL 07/07/2021: Examination completed, findings reviewed, pt educated on POC, HEP, and mobility modifications for bed mobility with log rolling to decrease pain and strain at abdomen. Pt also educated on taping technique and belly band. Pt denied allergies to tape/adhesives. Pt agreed to taping at abdomen for support and decreased pain, pt educated on how/when to remove and to remove if any irritations are noted. Pt agreed. Pt motivated to participate in PT and agreeable to attempt recommendations.  ? ?Pt educated on pregnancy precautions including but not limited to vaginal bleeding, contractions, leaking of fluid and fetal movement. If any of these occur halt activity and please consult OBGYN for recommended care.   ? ? ? ? ?PATIENT EDUCATION:  ?Education details: [id number], log rolling, taping, belly band ?Person educated: Patient ?Education method: Explanation, Demonstration, Tactile cues, Verbal cues, and Handouts ?Education comprehension: verbalized understanding and returned demonstration ? ? ?HOME EXERCISE PROGRAM: ?[facility] ? ?ASSESSMENT: ? ?CLINICAL IMPRESSION: ?Patient is a 31 y.o. female  who was seen today for  physical therapy evaluation and treatment for pelvic pain during pregnancy.  ? ? ?OBJECTIVE IMPAIRMENTS decreased coordination, decreased endurance, decreased mobility, decreased strength, increased fascial restrictions,  increased muscle spasms, impaired flexibility, improper body mechanics, postural dysfunction, and pain.  ? ?ACTIVITY LIMITATIONS community activity.  ? ?PERSONAL FACTORS Fitness and 1 comorbidity: x3 c-sections  are also affecting patient's functional outcome.  ? ? ?REHAB POTENTIAL: Good ? ?CLINICAL DECISION MAKING: Stable/uncomplicated ? ?EVALUATION COMPLEXITY: Low ? ? ?GOALS: ?Goals reviewed with patient? Yes ? ?SHORT TERM GOALS: Target date: 08/04/2021 ? ?Pt to be I with HEP. ?Baseline: ?Goal status: INITIAL ? ?2.  Pt to report no more than 3/10 pain at pelvis due to improved mechanics with mobility and use of belly band for support.  ?Baseline:  ?Goal status: INITIAL ? ?3.  Pt to demonstrate good lifting techniques of at least 10# without compensatory strategies for child care without pelvic pain.  ?Baseline:  ?Goal status: INITIAL ? ? ? ?LONG TERM GOALS: Target date: 10/06/2021 ? ?Pt to be I with advanced HEP. ?Baseline:  ?Goal status: INITIAL ? ?2.  Pt to report no more than 2/10 pain at pelvis due to improved mechanics with mobility and use of belly band for support. ?Baseline:  ?Goal status: INITIAL ? ?3.  Pt to demonstrate at least 5/5 bil hip strength for improved pelvic stability and functional squats for pregnancy progression and child care. ? ?Baseline:  ?Goal status: INITIAL ? ? ?PLAN: ?PT FREQUENCY: 1x/week ? ?PT DURATION:  5 sessions ? ?PLANNED INTERVENTIONS: Therapeutic exercises, Therapeutic activity, Neuromuscular re-education, Patient/Family education, Joint mobilization, Spinal mobilization, Cryotherapy, Moist heat, Manual lymph drainage, scar mobilization, and Manual therapy ? ?PLAN FOR NEXT SESSION: stretching at hips and spine, breathing mechanics ? ? ?Stacy Gardner, PT,  DPT ?04/12/231:57 PM  ? ?

## 2021-07-13 ENCOUNTER — Ambulatory Visit (INDEPENDENT_AMBULATORY_CARE_PROVIDER_SITE_OTHER): Payer: BC Managed Care – PPO | Admitting: Advanced Practice Midwife

## 2021-07-13 VITALS — BP 119/79 | HR 92 | Wt 190.0 lb

## 2021-07-13 DIAGNOSIS — O34219 Maternal care for unspecified type scar from previous cesarean delivery: Secondary | ICD-10-CM

## 2021-07-13 DIAGNOSIS — Z8759 Personal history of other complications of pregnancy, childbirth and the puerperium: Secondary | ICD-10-CM

## 2021-07-13 DIAGNOSIS — Z3480 Encounter for supervision of other normal pregnancy, unspecified trimester: Secondary | ICD-10-CM

## 2021-07-13 DIAGNOSIS — Z3A29 29 weeks gestation of pregnancy: Secondary | ICD-10-CM

## 2021-07-13 DIAGNOSIS — O24410 Gestational diabetes mellitus in pregnancy, diet controlled: Secondary | ICD-10-CM

## 2021-07-13 NOTE — Progress Notes (Signed)
? ?  PRENATAL VISIT NOTE ? ?Subjective:  ?Catherine Wu is a 31 y.o. [phone] at [redacted]w[redacted]d being seen today for ongoing prenatal care.  She is currently monitored for the following issues for this low-risk pregnancy and has Hx of cesarean section complicating pregnancy; History of gestational hypertension; Supervision of other normal pregnancy, antepartum; History of cholestasis during pregnancy; and Gestational diabetes on their problem list. ? ?Patient reports no complaints.  Contractions: Not present. Vag. Bleeding: None.  Movement: Present. Denies leaking of fluid.  ? ?The following portions of the patient's history were reviewed and updated as appropriate: allergies, current medications, past family history, past medical history, past social history, past surgical history and problem list.  ? ?Objective:  ? ?Vitals:  ? 07/13/21 1042  ?BP: 119/79  ?Pulse: 92  ?Weight: 190 lb (86.2 kg)  ? ? ?Fetal Status: Fetal Heart Rate (bpm): 143   Movement: Present    ? ?General:  Alert, oriented and cooperative. Patient is in no acute distress.  ?Skin: Skin is warm and dry. No rash noted.   ?Cardiovascular: Normal heart rate noted  ?Respiratory: Normal respiratory effort, no problems with respiration noted  ?Abdomen: Soft, gravid, appropriate for gestational age.  Pain/Pressure: Absent     ?Pelvic: Cervical exam deferred        ?Extremities: Normal range of motion.  Edema: None  ?Mental Status: Normal mood and affect. Normal behavior. Normal judgment and thought content.  ? ?Assessment and Plan:  ?Pregnancy: [id number] at [redacted]w[redacted]d ?1. Supervision of other normal pregnancy, antepartum ?--Anticipatory guidance about next visits/weeks of pregnancy given.  ? ?2. Diet controlled gestational diabetes mellitus (GDM) in second trimester ?--Reviewed 1 week of values and all fasting values elevated, highest at 105.  All PP values within range, most 90-110.   ?--Discussed with pt recommendation to start Metformin QHS. Pt has made some  changes in last 2 days to increase her protein intake and would like to continue these changes. ?--Given that elevated values are all slightly out of range, pt to continue increased protein, daily exercise/walk, and protein snack every night  ?--Reevaluate in 2 weeks, start Metformin if still out of range. Pt agrees with this plan of care.  ? ?--Growth [place] scheduled ? ?3. Hx of cesarean section complicating pregnancy ?--Scheduled repeat at 39 weeks ? ?4. History of gestational hypertension ?--BP wnl, no s/sx of PEC ? ?5. [redacted] weeks gestation of pregnancy ? ? ?Preterm labor symptoms and general obstetric precautions including but not limited to vaginal bleeding, contractions, leaking of fluid and fetal movement were reviewed in detail with the patient. ?Please refer to After Visit Summary for other counseling recommendations.  ? ?Return in about 2 weeks (around 07/27/2021) for LOB. ? ?Future Appointments  ?Date Time Provider Department Center  ?07/14/2021 12:30 PM Barbaraann Faster, PT [facility] None  ?07/22/2021 10:15 AM Barbaraann Faster, PT [facility] None  ?07/27/2021  1:30 PM Rasch, Harolyn Rutherford, NP [facility]  ?07/28/2021  2:00 PM Barbaraann Faster, PT [facility] None  ?08/02/2021  1:15 PM [facility] NURSE [facility]  ?08/02/2021  1:30 PM [facility] US2 [facility]  ?08/04/2021 11:45 AM Barbaraann Faster, PT [facility] None  ?08/11/2021 11:45 AM Barbaraann Faster, PT [facility] None  ? ? ?Sharen Counter, CNM  ?

## 2021-07-14 ENCOUNTER — Ambulatory Visit: Payer: BC Managed Care – PPO | Admitting: Physical Therapy

## 2021-07-14 DIAGNOSIS — R293 Abnormal posture: Secondary | ICD-10-CM

## 2021-07-14 DIAGNOSIS — M62838 Other muscle spasm: Secondary | ICD-10-CM

## 2021-07-14 DIAGNOSIS — M6281 Muscle weakness (generalized): Secondary | ICD-10-CM

## 2021-07-14 DIAGNOSIS — O26899 Other specified pregnancy related conditions, unspecified trimester: Secondary | ICD-10-CM | POA: Diagnosis not present

## 2021-07-14 NOTE — Therapy (Signed)
?OUTPATIENT PHYSICAL THERAPY TREATMENT NOTE ? ? ?Patient Name: Catherine Wu ?MRN: [id number] ?DOB:1991/02/07, 31 y.o., female ?Today's Date: 07/14/2021 ? ?PCP: Patient, No Pcp Per (Inactive) ?REFERRING PROVIDER: Radene Gunning, MD ? ?END OF SESSION:  ? PT End of Session - 07/14/21 1243   ? ? Visit Number 2   ? Date for PT Re-Evaluation 10/06/21   ? Authorization Type BCBS   ? PT Start Time 1243   pt arrival time  ? PT Stop Time 1316   ? PT Time Calculation (min) 33 min   ? Activity Tolerance Patient tolerated treatment well   ? Behavior During Therapy [facility] for tasks assessed/performed   ? ?  ?  ? ?  ? ? ?Past Medical History:  ?Diagnosis Date  ? PONV (postoperative nausea and vomiting)   ? ?Past Surgical History:  ?Procedure Laterality Date  ? CESAREAN SECTION    ? CESAREAN SECTION N/A 05/28/2019  ? Procedure: CESAREAN SECTION;  Surgeon: Truett Mainland, DO;  Location: [place] [facility];  Service: Obstetrics;  Laterality: N/A;  ? ?Patient Active Problem List  ? Diagnosis Date Noted  ? Gestational diabetes 06/24/2021  ? History of cholestasis during pregnancy 03/04/2021  ? Supervision of other normal pregnancy, antepartum 03/01/2021  ? Hx of cesarean section complicating pregnancy [id number]  ? History of gestational hypertension 11/13/2018  ? ? ?REFERRING DIAG: O26.899,R10.2 (ICD-10-CM) - Pelvic pain in pregnancy ? ?THERAPY DIAG:  ?Muscle weakness (generalized) ? ?Abnormal posture ? ?Other muscle spasm ? ?PERTINENT HISTORY: [id number], HIV antibody, high-risk pregnancy,  Hx of cesarean section complicating pregnancy; History of gestational hypertension; Supervision of other normal pregnancy, antepartum; and History of cholestasis during pregnancy on their problem list. ?Sexual abuse: No ? ?PRECAUTIONS: pregnant  ? ?SUBJECTIVE: Pain gets worse with prolonged walking and trunk rotation dig bother Rt hip.  ? ?PAIN:  ?Are you having pain? No ? ? ? ? ? ?OBJECTIVE:  ?  ?DIAGNOSTIC FINDINGS:  ?  ?  ?COGNITION: ?            Overall cognitive status: Within functional limits for tasks assessed              ?            ?SENSATION: ?           Light touch: Appears intact ?           Proprioception: Appears intact ?  ?MUSCLE LENGTH: ?Bil hamstrings and adductors limited by 25% ?  ?  ?  ?FUNCTIONAL TESTS:  ?Singe leg stance (-) for pain today however did have hip drop noted when standing on Rt leg drop on Lt. Pt does report when she is having pain this is much more difficult however today is not having pain.  ?  ?  ?POSTURE:  ?Rounded shoulders, anterior pelvic tilt ?  ?LUMBARAROM/PROM ?  ?WFL however does report and demonstrate stiffness in bil rotation and side bending ?  ?LE ROM: ?Bil WFL ?  ?LE MMT: ?  ?Bil hip abduction 3+/5; all other 4/5 ?  ?PELVIC MMT: deferred at this time ?  ?MMT   ?07/07/2021  ?Vaginal    ?Internal Anal Sphincter    ?External Anal Sphincter    ?Puborectalis    ?Diastasis Recti    ?(Blank rows = not tested) ?  ?      PALPATION: ?  General  mild TTP at pubic symphysis ?  ?  External Perineal Exam deferred ?              ?              Internal Pelvic Floor deferred ?  ?TONE: ?deferred ?  ?PROLAPSE: ?deferred ?  ?TODAY'S TREATMENT  ?07/14/2021: ? Pt denied allergies to tape/adhesives. Pt agreed to taping at abdomen for support and decreased pain, pt educated on how/when to remove and to remove if any irritations are noted. Pt agreed ad reported "I loved the tape! It was so helpful". X5 Ktape applied at abdomen for supported.  ?2x10 pelvic tilts ant/post, rt/lt, CW and CCW circles ?X10 Sit to stand with TA activation gently ? ?EVAL 07/07/2021: Examination completed, findings reviewed, pt educated on POC, HEP, and mobility modifications for bed mobility with log rolling to decrease pain and strain at abdomen. Pt also educated on taping technique and belly band. Pt denied allergies to tape/adhesives. Pt agreed to taping at abdomen for support and decreased pain, pt educated on how/when to remove and to remove  if any irritations are noted. Pt agreed. Pt motivated to participate in PT and agreeable to attempt recommendations.  ?  ?Pt educated on pregnancy precautions including but not limited to vaginal bleeding, contractions, leaking of fluid and fetal movement. If any of these occur halt activity and please consult OBGYN for recommended care.   ?  ?  ?  ?  ?PATIENT EDUCATION:  ?Education details: [id number], log rolling, taping, belly band ?Person educated: Patient ?Education method: Explanation, Demonstration, Tactile cues, Verbal cues, and Handouts ?Education comprehension: verbalized understanding and returned demonstration ?  ?  ?HOME EXERCISE PROGRAM: ?[facility] ?  ?ASSESSMENT: ?  ?CLINICAL IMPRESSION: ?Patient presents with improved pain overall since eval and after tape removed pt reported some pain returned but not as bad. Pt session focused on mobility and stretching at pelvis. Pt would benefit from additional PT to further address deficits.   ?  ?  ?OBJECTIVE IMPAIRMENTS decreased coordination, decreased endurance, decreased mobility, decreased strength, increased fascial restrictions, increased muscle spasms, impaired flexibility, improper body mechanics, postural dysfunction, and pain.  ?  ?ACTIVITY LIMITATIONS community activity.  ?  ?PERSONAL FACTORS Fitness and 1 comorbidity: x3 c-sections  are also affecting patient's functional outcome.  ?  ?  ?REHAB POTENTIAL: Good ?  ?CLINICAL DECISION MAKING: Stable/uncomplicated ?  ?EVALUATION COMPLEXITY: Low ?  ?  ?GOALS: ?Goals reviewed with patient? Yes ?  ?SHORT TERM GOALS: Target date: 08/04/2021 ?  ?Pt to be I with HEP. ?Baseline: ?Goal status: INITIAL ?  ?2.  Pt to report no more than 3/10 pain at pelvis due to improved mechanics with mobility and use of belly band for support.  ?Baseline:  ?Goal status: INITIAL ?  ?3.  Pt to demonstrate good lifting techniques of at least 10# without compensatory strategies for child care without pelvic pain.  ?Baseline:  ?Goal  status: INITIAL ?  ?  ?  ?LONG TERM GOALS: Target date: 10/06/2021 ?  ?Pt to be I with advanced HEP. ?Baseline:  ?Goal status: INITIAL ?  ?2.  Pt to report no more than 2/10 pain at pelvis due to improved mechanics with mobility and use of belly band for support. ?Baseline:  ?Goal status: INITIAL ?  ?3.  Pt to demonstrate at least 5/5 bil hip strength for improved pelvic stability and functional squats for pregnancy progression and child care. ?  ?Baseline:  ?Goal status: INITIAL ?  ?  ?PLAN: ?PT FREQUENCY: 1x/week ?  ?  PT DURATION:  5 sessions ?  ?PLANNED INTERVENTIONS: Therapeutic exercises, Therapeutic activity, Neuromuscular re-education, Patient/Family education, Joint mobilization, Spinal mobilization, Cryotherapy, Moist heat, Manual lymph drainage, scar mobilization, and Manual therapy ?  ?PLAN FOR NEXT SESSION: stretching at hips and spine, breathing mechanics ? ? ? ?Junie Panning, PT ?07/14/2021, 2:01 PM ? ?  ? ?

## 2021-07-22 ENCOUNTER — Encounter: Payer: BC Managed Care – PPO | Admitting: Physical Therapy

## 2021-07-27 ENCOUNTER — Ambulatory Visit (INDEPENDENT_AMBULATORY_CARE_PROVIDER_SITE_OTHER): Payer: BC Managed Care – PPO | Admitting: Obstetrics and Gynecology

## 2021-07-27 VITALS — BP 118/70 | HR 71 | Wt 189.0 lb

## 2021-07-27 DIAGNOSIS — Z3480 Encounter for supervision of other normal pregnancy, unspecified trimester: Secondary | ICD-10-CM

## 2021-07-27 DIAGNOSIS — O24419 Gestational diabetes mellitus in pregnancy, unspecified control: Secondary | ICD-10-CM

## 2021-07-27 MED ORDER — METFORMIN HCL 500 MG PO TABS: 500.0000 mg | ORAL_TABLET | Freq: Every day | ORAL | 1 refills | Status: DC

## 2021-07-27 NOTE — Progress Notes (Signed)
? ?  PRENATAL VISIT NOTE ? ?Subjective:  ?Catherine Wu is a 31 y.o. [phone] at [redacted]w[redacted]d being seen today for ongoing prenatal care.  She is currently monitored for the following issues for this high-risk pregnancy and has Hx of cesarean section complicating pregnancy; History of gestational hypertension; Supervision of other normal pregnancy, antepartum; History of cholestasis during pregnancy; and Gestational diabetes on their problem list. ? ?Patient reports no complaints.  Contractions: Not present. Vag. Bleeding: None.  Movement: Present. Denies leaking of fluid.  ? ?The following portions of the patient's history were reviewed and updated as appropriate: allergies, current medications, past family history, past medical history, past social history, past surgical history and problem list.  ? ?Objective:  ? ?Vitals:  ? 07/27/21 1349 07/27/21 1352  ?BP:  118/70  ?Pulse:  71  ?Weight: 189 lb (85.7 kg) 189 lb (85.7 kg)  ? ? ?Fetal Status: Fetal Heart Rate (bpm): 146   Movement: Present    ? ?General:  Alert, oriented and cooperative. Patient is in no acute distress.  ?Skin: Skin is warm and dry. No rash noted.   ?Cardiovascular: Normal heart rate noted  ?Respiratory: Normal respiratory effort, no problems with respiration noted  ?Abdomen: Soft, gravid, appropriate for gestational age.  Pain/Pressure: Absent     ?Pelvic: Cervical exam deferred        ?Extremities: Normal range of motion.  Edema: None  ?Mental Status: Normal mood and affect. Normal behavior. Normal judgment and thought content.  ? ?Assessment and Plan:  ?Pregnancy: [id number] at [redacted]w[redacted]d ? ?1. Gestational diabetes mellitus (GDM), antepartum, gestational diabetes method of control unspecified ? ?Has tried peanut butter and apples/ bananas prior to bed.  ?Most of the fasting BS are greater than 100 ?PP blood sugars are normal; most are less or close to 100 ?Will start metformin 500 mg at bedtime. Discussed different types of low sugar, low carb, high  protein snacks for bedtime.  ? ?2. Supervision of other normal pregnancy, antepartum ? ?Continue BS log ?MFM [place] on 5/8 then Weekly testing @ 32 weeks.  ?Declines TDAP ?Repeat C/s is scheduled.  ? ?stetric precautions including but not limited to vaginal bleeding, contractions, leaking of fluid and fetal movement were reviewed in detail with the patient. ?Please refer to After Visit Summary for other counseling recommendations.  ? ?Return 2 weeks for HROB. ? ?Future Appointments  ?Date Time Provider [place]  ?07/28/2021  2:00 PM Junie Panning, PT [facility] None  ?08/02/2021  1:15 PM [facility] NURSE [facility]  ?08/02/2021  1:30 PM [facility] US2 [facility]  ?08/04/2021 11:45 AM Junie Panning, PT [facility] None  ?08/10/2021  4:10 PM Glenn Gullickson, Artist Pais, NP [facility]  ?08/11/2021 11:45 AM Junie Panning, PT [facility] None  ?08/18/2021 11:00 AM Junie Panning, PT [facility] None  ? ? ?Noni Saupe, NP  ?

## 2021-07-28 ENCOUNTER — Ambulatory Visit: Payer: BC Managed Care – PPO | Attending: Obstetrics and Gynecology | Admitting: Physical Therapy

## 2021-07-28 DIAGNOSIS — M6281 Muscle weakness (generalized): Secondary | ICD-10-CM | POA: Insufficient documentation

## 2021-07-28 DIAGNOSIS — R293 Abnormal posture: Secondary | ICD-10-CM | POA: Insufficient documentation

## 2021-07-28 DIAGNOSIS — M62838 Other muscle spasm: Secondary | ICD-10-CM | POA: Insufficient documentation

## 2021-07-28 DIAGNOSIS — R279 Unspecified lack of coordination: Secondary | ICD-10-CM | POA: Insufficient documentation

## 2021-07-28 NOTE — Therapy (Signed)
?OUTPATIENT PHYSICAL THERAPY TREATMENT NOTE ? ? ?Patient Name: Catherine Wu ?MRN: [id number] ?DOB:May 28, 1990, 31 y.o., female ?Today's Date: 07/28/2021 ? ?PCP: Patient, No Pcp Per (Inactive) ?REFERRING PROVIDER: Radene Gunning, MD ? ? ?Past Medical History:  ?Diagnosis Date  ? PONV (postoperative nausea and vomiting)   ? ?Past Surgical History:  ?Procedure Laterality Date  ? CESAREAN SECTION    ? CESAREAN SECTION N/A 05/28/2019  ? Procedure: CESAREAN SECTION;  Surgeon: Truett Mainland, DO;  Location: [place] [facility];  Service: Obstetrics;  Laterality: N/A;  ? ?Patient Active Problem List  ? Diagnosis Date Noted  ? Gestational diabetes 06/24/2021  ? History of cholestasis during pregnancy 03/04/2021  ? Supervision of other normal pregnancy, antepartum 03/01/2021  ? Hx of cesarean section complicating pregnancy [id number]  ? History of gestational hypertension 11/13/2018  ? ? ?REFERRING DIAG: O26.899,R10.2 (ICD-10-CM) - Pelvic pain in pregnancy ? ?THERAPY DIAG:  ?Other muscle spasm ? ?Abnormal posture ? ?Muscle weakness (generalized) ? ?PERTINENT HISTORY: [id number], HIV antibody, high-risk pregnancy,  Hx of cesarean section complicating pregnancy; History of gestational hypertension; Supervision of other normal pregnancy, antepartum; and History of cholestasis during pregnancy on their problem list. ?Sexual abuse: No ? ?PRECAUTIONS: pregnant  ? 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  ? ?PAIN:  ?Are you having pain? No ? ? ? ? ? ?OBJECTIVE:  ?  ?DIAGNOSTIC FINDINGS:  ?  ?  ?COGNITION: ?           Overall cognitive status: Within functional limits for tasks assessed              ?            ?SENSATION: ?           Light touch: Appears intact ?           Proprioception: Appears intact ?  ?MUSCLE LENGTH: ?Bil hamstrings and adductors limited by 25% ?  ?  ?  ?FUNCTIONAL TESTS:  ?Singe leg stance (-) for pain today however did have hip drop noted when  standing on Rt leg drop on Lt. Pt does report when she is having pain this is much more difficult however today is not having pain.  ?  ?  ?POSTURE:  ?Rounded shoulders, anterior pelvic tilt ?  ?LUMBARAROM/PROM ?  ?WFL however does report and demonstrate stiffness in bil rotation and side bending ?  ?LE ROM: ?Bil WFL ?  ?LE MMT: ?  ?Bil hip abduction 3+/5; all other 4/5 ?  ?PELVIC MMT: deferred at this time ?  ?MMT   ?07/07/2021  ?Vaginal    ?Internal Anal Sphincter    ?External Anal Sphincter    ?Puborectalis    ?Diastasis Recti    ?(Blank rows = not tested) ?  ?      PALPATION: ?  General  mild TTP at pubic symphysis ?  ?              External Perineal Exam deferred ?              ?              Internal Pelvic Floor deferred ?  ?TONE: ?deferred ?  ?PROLAPSE: ?deferred ?  ?TODAY'S TREATMENT  ? ?07/28/2021: ?Pelvic circles, ant/post tilts, and Rt/Lt on green ball x10 each ?Hip shift on green ball 2x30s each ?Quad hip shifts on yoga block x30s each ?Palloff green band x10 cued for  breathing mechanics ?X5 mini lunges with semi circle bolster for cushion ?Hip 90/90 stretch x30s each ?Pt denied allergies to tape/adhesives. Pt agreed to taping at abdomen for support and decreased pain, pt educated on how/when to remove and to remove if any irritations are noted. Pt agreed and continues to report this is helpful. X5 Ktape applied at abdomen for support and decreased pain.  ? ?07/14/2021: ? Pt denied allergies to tape/adhesives. Pt agreed to taping at abdomen for support and decreased pain, pt educated on how/when to remove and to remove if any irritations are noted. Pt agreed ad reported "I loved the tape! It was so helpful". X5 Ktape applied at abdomen for supported.  ?2x10 pelvic tilts ant/post, rt/lt, CW and CCW circles ?X10 Sit to stand with TA activation gently ? ?EVAL 07/07/2021: Examination completed, findings reviewed, pt educated on POC, HEP, and mobility modifications for bed mobility with log rolling to decrease  pain and strain at abdomen. Pt also educated on taping technique and belly band. Pt denied allergies to tape/adhesives. Pt agreed to taping at abdomen for support and decreased pain, pt educated on how/when to remove and to remove if any irritations are noted. Pt agreed. Pt motivated to participate in PT and agreeable to attempt recommendations.  ?  ?Pt educated on pregnancy precautions including but not limited to vaginal bleeding, contractions, leaking of fluid and fetal movement. If any of these occur halt activity and please consult OBGYN for recommended care.   ?  ?  ?  ?  ?PATIENT EDUCATION:  ?Education details: [id number], log rolling, taping, belly band ?Person educated: Patient ?Education method: Explanation, Demonstration, Tactile cues, Verbal cues, and Handouts ?Education comprehension: verbalized understanding and returned demonstration ?  ?  ?HOME EXERCISE PROGRAM: ?[facility] ?  ?ASSESSMENT: ?  ?CLINICAL IMPRESSION: ?Patient presents with improved pain overall since eval and after tape removed pt reported some pain returned but not as bad. Pt session focused on mobility and stretching at pelvis. Pt would benefit from additional PT to further address deficits.   ?  ?  ?OBJECTIVE IMPAIRMENTS decreased coordination, decreased endurance, decreased mobility, decreased strength, increased fascial restrictions, increased muscle spasms, impaired flexibility, improper body mechanics, postural dysfunction, and pain.  ?  ?ACTIVITY LIMITATIONS community activity.  ?  ?PERSONAL FACTORS Fitness and 1 comorbidity: x3 c-sections  are also affecting patient's functional outcome.  ?  ?  ?REHAB POTENTIAL: Good ?  ?CLINICAL DECISION MAKING: Stable/uncomplicated ?  ?EVALUATION COMPLEXITY: Low ?  ?  ?GOALS: ?Goals reviewed with patient? Yes ?  ?SHORT TERM GOALS: Target date: 08/04/2021 ?  ?Pt to be I with HEP. ?Baseline: ?Goal status: INITIAL ?  ?2.  Pt to report no more than 3/10 pain at pelvis due to improved mechanics with  mobility and use of belly band for support.  ?Baseline:  ?Goal status: INITIAL ?  ?3.  Pt to demonstrate good lifting techniques of at least 10# without compensatory strategies for child care without pelvic pain.  ?Baseline:  ?Goal status: INITIAL ?  ?  ?  ?LONG TERM GOALS: Target date: 10/06/2021 ?  ?Pt to be I with advanced HEP. ?Baseline:  ?Goal status: INITIAL ?  ?2.  Pt to report no more than 2/10 pain at pelvis due to improved mechanics with mobility and use of belly band for support. ?Baseline:  ?Goal status: INITIAL ?  ?3.  Pt to demonstrate at least 5/5 bil hip strength for improved pelvic stability and functional squats for pregnancy progression and child  care. ?  ?Baseline:  ?Goal status: INITIAL ?  ?  ?PLAN: ?PT FREQUENCY: 1x/week ?  ?PT DURATION:  5 sessions ?  ?PLANNED INTERVENTIONS: Therapeutic exercises, Therapeutic activity, Neuromuscular re-education, Patient/Family education, Joint mobilization, Spinal mobilization, Cryotherapy, Moist heat, Manual lymph drainage, scar mobilization, and Manual therapy ?  ?PLAN FOR NEXT SESSION: stretching at hips and spine, breathing mechanics ? ? ? ?Stacy Gardner, PT, DPT ?05/03/232:46 PM  ? ?  ? ?

## 2021-08-02 ENCOUNTER — Ambulatory Visit: Payer: BC Managed Care – PPO | Attending: Obstetrics and Gynecology

## 2021-08-02 ENCOUNTER — Other Ambulatory Visit: Payer: Self-pay | Admitting: Obstetrics and Gynecology

## 2021-08-02 ENCOUNTER — Ambulatory Visit: Payer: BC Managed Care – PPO | Admitting: *Deleted

## 2021-08-02 ENCOUNTER — Ambulatory Visit (HOSPITAL_BASED_OUTPATIENT_CLINIC_OR_DEPARTMENT_OTHER): Payer: BC Managed Care – PPO | Admitting: Obstetrics and Gynecology

## 2021-08-02 VITALS — BP 127/66 | HR 84

## 2021-08-02 DIAGNOSIS — Z3A32 32 weeks gestation of pregnancy: Secondary | ICD-10-CM

## 2021-08-02 DIAGNOSIS — Z8719 Personal history of other diseases of the digestive system: Secondary | ICD-10-CM | POA: Insufficient documentation

## 2021-08-02 DIAGNOSIS — Z3480 Encounter for supervision of other normal pregnancy, unspecified trimester: Secondary | ICD-10-CM | POA: Insufficient documentation

## 2021-08-02 DIAGNOSIS — O24419 Gestational diabetes mellitus in pregnancy, unspecified control: Secondary | ICD-10-CM | POA: Insufficient documentation

## 2021-08-02 DIAGNOSIS — O34219 Maternal care for unspecified type scar from previous cesarean delivery: Secondary | ICD-10-CM

## 2021-08-02 DIAGNOSIS — O358XX0 Maternal care for other (suspected) fetal abnormality and damage, not applicable or unspecified: Secondary | ICD-10-CM | POA: Diagnosis not present

## 2021-08-02 DIAGNOSIS — O24415 Gestational diabetes mellitus in pregnancy, controlled by oral hypoglycemic drugs: Secondary | ICD-10-CM | POA: Diagnosis not present

## 2021-08-02 DIAGNOSIS — O09293 Supervision of pregnancy with other poor reproductive or obstetric history, third trimester: Secondary | ICD-10-CM | POA: Diagnosis not present

## 2021-08-02 DIAGNOSIS — Z8759 Personal history of other complications of pregnancy, childbirth and the puerperium: Secondary | ICD-10-CM | POA: Insufficient documentation

## 2021-08-02 NOTE — Progress Notes (Signed)
Maternal-Fetal Medicine  ? ?Name: Catherine Wu ?DOB: 08/23/1990 ?MRN: [id number] ?Referring Provider: Venia Carbon, NP ? ?I had the pleasure of seeing [name] [facility] at [facility]. She is G6 P3 at 32-weeks' gestation and is here for ultrasound evaluation. ?She has gestational diabetes. ?She reports her fasting levels are around 115 mg/dL and postprandial levels are within normal range.  She just started taking metformin. ?Her blood pressures have been normal at prenatal visits.  Blood pressure today at her office is 127/66 mmHg. ? ?Obstetric history significant for 3 term cesarean deliveries.  Previous pregnancy was complicated by gestational hypertension and cholestasis ? ?Ultrasound ?Fetal growth is appropriate for gestational age.  Amniotic fluid is normal and good fetal activity seen.  Antenatal testing is reassuring. BPP 8/8. ?Placenta is posterior and there is no evidence of previa or placenta accreta spectrum. ?Choroid plexus cysts have resolved.  ? ?Gestational diabetes ?I explained the diagnosis of gestational diabetes.  I emphasized the importance of good blood glucose control to prevent adverse fetal or neonatal outcomes.  I discussed blood glucose normal values. I encouraged her to check her blood glucose regularly. ?Possible complications of gestational diabetes include fetal macrosomia, shoulder dystocia and birth injuries, stillbirth (in poorly controlled diabetes) and neonatal respiratory syndrome and other complications. ? ?Exercise reduces the need for insulin.  Medical treatment includes oral hypoglycemics or insulin. ? ?Timing of delivery: In well-controlled diabetes on diet, patient can be delivered at 39-weeks' gestation. Vaginal delivery is not contraindicated. ?Type 2 diabetes develops in up to 50% of women with GDM. I recommend postpartum screening with 75-g glucose load at 6 to 12 weeks after delivery. ? ?Recommendations ?-NST in 1 and 3 weeks at your office at  [facility] (patient's request for convenience), ?-BPP in 2 weeks at our office. ?-Fetal growth and BPP in 4 weeks. ? ? ?Thank you for consultation.  If you have any questions or concerns, please contact me the [facility].  Consultation including face-to-face (more than 50%) counseling 30 minutes. ? ?

## 2021-08-03 ENCOUNTER — Other Ambulatory Visit: Payer: Self-pay | Admitting: *Deleted

## 2021-08-03 DIAGNOSIS — O34219 Maternal care for unspecified type scar from previous cesarean delivery: Secondary | ICD-10-CM

## 2021-08-03 DIAGNOSIS — Z8759 Personal history of other complications of pregnancy, childbirth and the puerperium: Secondary | ICD-10-CM

## 2021-08-03 DIAGNOSIS — O24415 Gestational diabetes mellitus in pregnancy, controlled by oral hypoglycemic drugs: Secondary | ICD-10-CM

## 2021-08-04 ENCOUNTER — Encounter: Payer: BC Managed Care – PPO | Admitting: Physical Therapy

## 2021-08-10 ENCOUNTER — Ambulatory Visit (INDEPENDENT_AMBULATORY_CARE_PROVIDER_SITE_OTHER): Payer: BC Managed Care – PPO | Admitting: Obstetrics and Gynecology

## 2021-08-10 ENCOUNTER — Ambulatory Visit (INDEPENDENT_AMBULATORY_CARE_PROVIDER_SITE_OTHER): Payer: BC Managed Care – PPO

## 2021-08-10 VITALS — BP 121/64 | HR 78

## 2021-08-10 DIAGNOSIS — O24419 Gestational diabetes mellitus in pregnancy, unspecified control: Secondary | ICD-10-CM

## 2021-08-10 DIAGNOSIS — Z3480 Encounter for supervision of other normal pregnancy, unspecified trimester: Secondary | ICD-10-CM | POA: Diagnosis not present

## 2021-08-10 MED ORDER — METFORMIN HCL 500 MG PO TABS
1000.0000 mg | ORAL_TABLET | Freq: Every day | ORAL | 1 refills | Status: DC
Start: 2021-08-10 — End: 2021-09-24

## 2021-08-10 NOTE — Progress Notes (Signed)
   PRENATAL VISIT NOTE  Subjective:  Catherine Wu is a 31 y.o. [phone] at [redacted]w[redacted]d being seen today for ongoing prenatal care.  She is currently monitored for the following issues for this high-risk pregnancy and has Hx of cesarean section complicating pregnancy; History of gestational hypertension; Supervision of other normal pregnancy, antepartum; History of cholestasis during pregnancy; and Gestational diabetes on their problem list.  Patient reports no complaints.  Contractions: Not present. Vag. Bleeding: None.  Movement: Present. Denies leaking of fluid.   The following portions of the patient's history were reviewed and updated as appropriate: allergies, current medications, past family history, past medical history, past social history, past surgical history and problem list.   Objective:   Vitals:   08/10/21 1553  BP: 121/64  Pulse: 78    Fetal Status: Fetal Heart Rate (bpm): NST   Movement: Present     General:  Alert, oriented and cooperative. Patient is in no acute distress.  Skin: Skin is warm and dry. No rash noted.   Cardiovascular: Normal heart rate noted  Respiratory: Normal respiratory effort, no problems with respiration noted  Abdomen: Soft, gravid, appropriate for gestational age.  Pain/Pressure: Absent     Pelvic: Cervical exam deferred        Extremities: Normal range of motion.  Edema: None  Mental Status: Normal mood and affect. Normal behavior. Normal judgment and thought content.   Assessment and Plan:  Pregnancy: [id number] at [redacted]w[redacted]d   1. Supervision of other normal pregnancy, antepartum  Doing well + fetal movement.   2. Gestational diabetes mellitus (GDM), antepartum, gestational diabetes method of control unspecified  NST: 120 BPM, 15x15 moderate accels, no decels BPP and growth [place] with MFM; last growth was 1773 gm.  PP BS all WNL Fasting BS all greater than 100.  Increase metformin to 1,000 mg at bedtime. Discussed the possibility of  night time insulin.   Preterm labor symptoms and general obstetric precautions including but not limited to vaginal bleeding, contractions, leaking of fluid and fetal movement were reviewed in detail with the patient. Please refer to After Visit Summary for other counseling recommendations.   No follow-ups on file.  Future Appointments  Date Time Provider Department Center  08/18/2021  7:15 AM [facility] NURSE [facility]  08/18/2021  7:30 AM [facility] US2 [facility]  08/18/2021 11:00 AM Barbaraann Faster, PT [facility] None  08/24/2021  3:30 PM [facility]SE [facility]  08/24/2021  3:50 PM Brand Males, CNM [facility]  08/31/2021  1:30 PM [facility] NURSE [facility]  08/31/2021  1:45 PM [facility] US5 [facility]    Venia Carbon, NP

## 2021-08-10 NOTE — Progress Notes (Signed)
NST reactive and reviewed by Noni Saupe, NP.

## 2021-08-11 ENCOUNTER — Ambulatory Visit: Payer: BC Managed Care – PPO | Admitting: Physical Therapy

## 2021-08-11 DIAGNOSIS — M6281 Muscle weakness (generalized): Secondary | ICD-10-CM

## 2021-08-11 DIAGNOSIS — R293 Abnormal posture: Secondary | ICD-10-CM

## 2021-08-11 DIAGNOSIS — M62838 Other muscle spasm: Secondary | ICD-10-CM | POA: Diagnosis not present

## 2021-08-11 DIAGNOSIS — R279 Unspecified lack of coordination: Secondary | ICD-10-CM

## 2021-08-11 NOTE — Therapy (Signed)
?OUTPATIENT PHYSICAL THERAPY TREATMENT NOTE ? ? ?Patient Name: Catherine Wu ?MRN: [id number] ?DOB:1990-05-17, 31 y.o., female ?Today's Date: 08/11/2021 ? ?PCP: Patient, No Pcp Per (Inactive) ?REFERRING PROVIDER: Milas Hockuncan, Paula, MD ? ? ?Past Medical History:  ?Diagnosis Date  ? PONV (postoperative nausea and vomiting)   ? ?Past Surgical History:  ?Procedure Laterality Date  ? CESAREAN SECTION    ? CESAREAN SECTION N/A 05/28/2019  ? Procedure: CESAREAN SECTION;  Surgeon: Levie HeritageStinson, Jacob J, DO;  Location: [facility];  Service: Obstetrics;  Laterality: N/A;  ? ?Patient Active Problem List  ? Diagnosis Date Noted  ? Gestational diabetes 06/24/2021  ? History of cholestasis during pregnancy 03/04/2021  ? Supervision of other normal pregnancy, antepartum 03/01/2021  ? Hx of cesarean section complicating pregnancy 11/13/2018  ? History of gestational hypertension 11/13/2018  ? ? ?REFERRING DIAG: O26.899,R10.2 (ICD-10-CM) - Pelvic pain in pregnancy ? ?THERAPY DIAG:  ?Muscle weakness (generalized) ? ?Abnormal posture ? ?Unspecified lack of coordination ? ?Other muscle spasm ? ?PERTINENT HISTORY: [id number], HIV antibody, high-risk pregnancy,  Hx of cesarean section complicating pregnancy; History of gestational hypertension; Supervision of other normal pregnancy, antepartum; and History of cholestasis during pregnancy on their problem list. ?Sexual abuse: No ? ?PRECAUTIONS: pregnant  ? ?SUBJECTIVE: Pain has been improving overall and has had less leakage since starting PT.  ? ?PAIN:  ?Are you having pain? No ? ? ? ? ? ?OBJECTIVE:  ?  ?DIAGNOSTIC FINDINGS:  ?  ?  ?COGNITION: ?           Overall cognitive status: Within functional limits for tasks assessed              ?            ?SENSATION: ?           Light touch: Appears intact ?           Proprioception: Appears intact ?  ?MUSCLE LENGTH: ?Bil hamstrings and adductors limited by 25% ?  ?  ?  ?FUNCTIONAL TESTS:  ?Singe leg stance (-) for pain today however did have hip  drop noted when standing on Rt leg drop on Lt. Pt does report when she is having pain this is much more difficult however today is not having pain.  ?  ?  ?POSTURE:  ?Rounded shoulders, anterior pelvic tilt ?  ?LUMBARAROM/PROM ?  ?WFL however does report and demonstrate stiffness in bil rotation and side bending ?  ?LE ROM: ?Bil WFL ?  ?LE MMT: ?  ?Bil hip abduction 3+/5; all other 4/5 ?  ?PELVIC MMT: deferred at this time ?  ?MMT   ?07/07/2021  ?Vaginal    ?Internal Anal Sphincter    ?External Anal Sphincter    ?Puborectalis    ?Diastasis Recti    ?(Blank rows = not tested) ?  ?      PALPATION: ?  General  mild TTP at pubic symphysis ?  ?              External Perineal Exam deferred ?              ?              Internal Pelvic Floor deferred ?  ?TONE: ?deferred ?  ?PROLAPSE: ?deferred ?  ?TODAY'S TREATMENT  ? ?08/11/2021; ?Body weight Squats 2x10  ?Half Kneeling Diagonal Chops with blue band 2x10 each ?Deadlifts with blue band 2x10 ?Sidelying Reverse Clamshell 2x10 ?Hip IR stretch 2x30s in semi reclined  position ?Sidelying thoracic opener 2x30s each ?Piriformis stretch 2x30s ?Puppy pose 1 min with diaphragmatic breathing  ?Alternating overhead press with 4# hand weights x10 ?Blue band lat pulldown 2x10 ? ?07/28/2021: ?Pelvic circles, ant/post tilts, and Rt/Lt on green ball x10 each ?Hip shift on green ball 2x30s each ?Quad hip shifts on yoga block x30s each ?Palloff green band x10 cued for breathing mechanics ?X5 mini lunges with semi circle bolster for cushion ?Hip 90/90 stretch x30s each ?Pt denied allergies to tape/adhesives. Pt agreed to taping at abdomen for support and decreased pain, pt educated on how/when to remove and to remove if any irritations are noted. Pt agreed and continues to report this is helpful. X5 Ktape applied at abdomen for support and decreased pain.  ? ?07/14/2021: ? Pt denied allergies to tape/adhesives. Pt agreed to taping at abdomen for support and decreased pain, pt educated on how/when  to remove and to remove if any irritations are noted. Pt agreed ad reported "I loved the tape! It was so helpful". X5 Ktape applied at abdomen for supported.  ?2x10 pelvic tilts ant/post, rt/lt, CW and CCW circles ?X10 Sit to stand with TA activation gently ? ?EVAL 07/07/2021: Examination completed, findings reviewed, pt educated on POC, HEP, and mobility modifications for bed mobility with log rolling to decrease pain and strain at abdomen. Pt also educated on taping technique and belly band. Pt denied allergies to tape/adhesives. Pt agreed to taping at abdomen for support and decreased pain, pt educated on how/when to remove and to remove if any irritations are noted. Pt agreed. Pt motivated to participate in PT and agreeable to attempt recommendations.  ?  ?Pt educated on pregnancy precautions including but not limited to vaginal bleeding, contractions, leaking of fluid and fetal movement. If any of these occur halt activity and please consult OBGYN for recommended care.   ?  ?  ?  ?  ?PATIENT EDUCATION:  ?Education details: [id number], log rolling, taping, belly band ?Person educated: Patient ?Education method: Explanation, Demonstration, Tactile cues, Verbal cues, and Handouts ?Education comprehension: verbalized understanding and returned demonstration ?  ?  ?HOME EXERCISE PROGRAM: ?[facility] ?  ?ASSESSMENT: ?  ?CLINICAL IMPRESSION: ?Patient reports she does continue to have pain but improving. PT session focused on hip strengthening with gentle core activations for improved stability and decreased pain. Pt tolerated well, denied pain at end of session. Pt would benefit from additional PT to further address deficits.   ?  ?  ?OBJECTIVE IMPAIRMENTS decreased coordination, decreased endurance, decreased mobility, decreased strength, increased fascial restrictions, increased muscle spasms, impaired flexibility, improper body mechanics, postural dysfunction, and pain.  ?  ?ACTIVITY LIMITATIONS community activity.  ?   ?PERSONAL FACTORS Fitness and 1 comorbidity: x3 c-sections  are also affecting patient's functional outcome.  ?  ?  ?REHAB POTENTIAL: Good ?  ?CLINICAL DECISION MAKING: Stable/uncomplicated ?  ?EVALUATION COMPLEXITY: Low ?  ?  ?GOALS: ?Goals reviewed with patient? Yes ?  ?SHORT TERM GOALS: Target date: 08/04/2021 ?  ?Pt to be I with HEP. ?Baseline: ?Goal status: INITIAL ?  ?2.  Pt to report no more than 3/10 pain at pelvis due to improved mechanics with mobility and use of belly band for support.  ?Baseline:  ?Goal status: INITIAL ?  ?3.  Pt to demonstrate good lifting techniques of at least 10# without compensatory strategies for child care without pelvic pain.  ?Baseline:  ?Goal status: INITIAL ?  ?  ?  ?LONG TERM GOALS: Target date: 10/06/2021 ?  ?  Pt to be I with advanced HEP. ?Baseline:  ?Goal status: INITIAL ?  ?2.  Pt to report no more than 2/10 pain at pelvis due to improved mechanics with mobility and use of belly band for support. ?Baseline:  ?Goal status: INITIAL ?  ?3.  Pt to demonstrate at least 5/5 bil hip strength for improved pelvic stability and functional squats for pregnancy progression and child care. ?  ?Baseline:  ?Goal status: INITIAL ?  ?  ?PLAN: ?PT FREQUENCY: 1x/week ?  ?PT DURATION:  5 sessions ?  ?PLANNED INTERVENTIONS: Therapeutic exercises, Therapeutic activity, Neuromuscular re-education, Patient/Family education, Joint mobilization, Spinal mobilization, Cryotherapy, Moist heat, Manual lymph drainage, scar mobilization, and Manual therapy ?  ?PLAN FOR NEXT SESSION: stretching at hips and spine, breathing mechanics ? ? ?Otelia Sergeant, PT, DPT ?08/12/2310:29 PM  ? ?  ? ?

## 2021-08-18 ENCOUNTER — Ambulatory Visit: Payer: BC Managed Care – PPO

## 2021-08-18 ENCOUNTER — Ambulatory Visit: Payer: BC Managed Care – PPO | Attending: Obstetrics and Gynecology

## 2021-08-18 ENCOUNTER — Ambulatory Visit: Payer: BC Managed Care – PPO | Admitting: Physical Therapy

## 2021-08-18 ENCOUNTER — Ambulatory Visit: Payer: BC Managed Care – PPO | Admitting: *Deleted

## 2021-08-18 VITALS — BP 121/58 | HR 97

## 2021-08-18 DIAGNOSIS — O34219 Maternal care for unspecified type scar from previous cesarean delivery: Secondary | ICD-10-CM | POA: Diagnosis not present

## 2021-08-18 DIAGNOSIS — M62838 Other muscle spasm: Secondary | ICD-10-CM | POA: Diagnosis not present

## 2021-08-18 DIAGNOSIS — M6281 Muscle weakness (generalized): Secondary | ICD-10-CM

## 2021-08-18 DIAGNOSIS — O24415 Gestational diabetes mellitus in pregnancy, controlled by oral hypoglycemic drugs: Secondary | ICD-10-CM | POA: Insufficient documentation

## 2021-08-18 DIAGNOSIS — R293 Abnormal posture: Secondary | ICD-10-CM

## 2021-08-18 DIAGNOSIS — Z3A34 34 weeks gestation of pregnancy: Secondary | ICD-10-CM

## 2021-08-18 DIAGNOSIS — Z8759 Personal history of other complications of pregnancy, childbirth and the puerperium: Secondary | ICD-10-CM | POA: Diagnosis present

## 2021-08-18 DIAGNOSIS — O24419 Gestational diabetes mellitus in pregnancy, unspecified control: Secondary | ICD-10-CM | POA: Insufficient documentation

## 2021-08-18 NOTE — Therapy (Signed)
OUTPATIENT PHYSICAL THERAPY TREATMENT NOTE   Patient Name: Catherine Wu MRN: [id number] DOB:1991-01-11, 31 y.o., female Today's Date: 08/18/2021  PCP: Patient, No Pcp Per (Inactive) REFERRING PROVIDER: Radene Gunning, MD   Past Medical History:  Diagnosis Date   PONV (postoperative nausea and vomiting)    Past Surgical History:  Procedure Laterality Date   CESAREAN SECTION     CESAREAN SECTION N/A 05/28/2019   Procedure: CESAREAN SECTION;  Surgeon: Truett Mainland, DO;  Location: [facility] LD [facility]S;  Service: Obstetrics;  Laterality: N/A;   Patient Active Problem List   Diagnosis Date Noted   Gestational diabetes 06/24/2021   History of cholestasis during pregnancy 03/04/2021   Supervision of other normal pregnancy, antepartum 03/01/2021   Hx of cesarean section complicating pregnancy 85/27/7824   History of gestational hypertension 11/13/2018    REFERRING DIAG: O26.899,R10.2 (ICD-10-CM) - Pelvic pain in pregnancy  THERAPY DIAG:  Muscle weakness (generalized)  Abnormal posture  Other muscle spasm  PERTINENT HISTORY: [id number], HIV antibody, high-risk pregnancy,  Hx of cesarean section complicating pregnancy; History of gestational hypertension; Supervision of other normal pregnancy, antepartum; and History of cholestasis during pregnancy on their problem list. Sexual abuse: No  PRECAUTIONS: pregnant   SUBJECTIVE: Pain was worse yesterday after a long photo session but much better today.   PAIN:  Are you having pain? Yes: NPRS scale: 1/10 Pain location: anterior pelvis into bil groin  Pain description: achy Aggravating factors: single leg activity  Relieving factors: rest and tape       OBJECTIVE:    DIAGNOSTIC FINDINGS:      COGNITION:            Overall cognitive status: Within functional limits for tasks assessed                          SENSATION:            Light touch: Appears intact            Proprioception: Appears intact   MUSCLE  LENGTH: Bil hamstrings and adductors limited by 25%       FUNCTIONAL TESTS:  Singe leg stance (-) for pain today however did have hip drop noted when standing on Rt leg drop on Lt. Pt does report when she is having pain this is much more difficult however today is not having pain.      POSTURE:  Rounded shoulders, anterior pelvic tilt   LUMBARAROM/PROM   WFL however does report and demonstrate stiffness in bil rotation and side bending   LE ROM: Bil WFL   LE MMT:   Bil hip abduction 3+/5; all other 4/5   PELVIC MMT: deferred at this time   MMT   07/07/2021  Vaginal    Internal Anal Sphincter    External Anal Sphincter    Puborectalis    Diastasis Recti    (Blank rows = not tested)         PALPATION:   General  mild TTP at pubic symphysis                 External Perineal Exam deferred                             Internal Pelvic Floor deferred   TONE: deferred   PROLAPSE: deferred   TODAY'S TREATMENT   08/18/2021: Taping at abdomen for support as pt reports this  helps with pain  2x10 body weight squats  Deadlifts with blue band 2x10 Palloffs blue band 2x10  Sidelying Reverse Clamshell 2x10 Sidelying thoracic opener 2x30s each Piriformis stretch 2x30s Puppy pose 1 min with diaphragmatic breathing    08/11/2021; Body weight Squats 2x10  Half Kneeling Diagonal Chops with blue band 2x10 each Deadlifts with blue band 2x10 Sidelying Reverse Clamshell 2x10 Hip IR stretch 2x30s in semi reclined position Sidelying thoracic opener 2x30s each Piriformis stretch 2x30s Puppy pose 1 min with diaphragmatic breathing  Alternating overhead press with 4# hand weights x10 Blue band lat pulldown 2x10  07/28/2021: Pelvic circles, ant/post tilts, and Rt/Lt on green ball x10 each Hip shift on green ball 2x30s each Quad hip shifts on yoga block x30s each Palloff green band x10 cued for breathing mechanics X5 mini lunges with semi circle bolster for cushion Hip 90/90  stretch x30s each Pt denied allergies to tape/adhesives. Pt agreed to taping at abdomen for support and decreased pain, pt educated on how/when to remove and to remove if any irritations are noted. Pt agreed and continues to report this is helpful. X5 Ktape applied at abdomen for support and decreased pain.   07/14/2021:  Pt denied allergies to tape/adhesives. Pt agreed to taping at abdomen for support and decreased pain, pt educated on how/when to remove and to remove if any irritations are noted. Pt agreed ad reported "I loved the tape! It was so helpful". X5 Ktape applied at abdomen for supported.  2x10 pelvic tilts ant/post, rt/lt, CW and CCW circles X10 Sit to stand with TA activation gently  EVAL 07/07/2021: Examination completed, findings reviewed, pt educated on POC, HEP, and mobility modifications for bed mobility with log rolling to decrease pain and strain at abdomen. Pt also educated on taping technique and belly band. Pt denied allergies to tape/adhesives. Pt agreed to taping at abdomen for support and decreased pain, pt educated on how/when to remove and to remove if any irritations are noted. Pt agreed. Pt motivated to participate in PT and agreeable to attempt recommendations.    Pt educated on pregnancy precautions including but not limited to vaginal bleeding, contractions, leaking of fluid and fetal movement. If any of these occur halt activity and please consult OBGYN for recommended care.           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 Pt reports she does feel better but motivated to continue HEP and would like to  return for postpartum PT as needed. Pt educated she would need a new referral for this. Session today focused on reviewing updated HEP and strengthening  and stretching of hips and core with breathing mechanics to improve mobility and decrease pain. Pt tolerated well. Pt confident in DC today and this will serve as her DC at end of session.      OBJECTIVE IMPAIRMENTS decreased coordination, decreased endurance, decreased mobility, decreased strength, increased fascial restrictions, increased muscle spasms, impaired flexibility, improper body mechanics, postural dysfunction, and pain.    ACTIVITY LIMITATIONS community activity.    PERSONAL FACTORS Fitness and 1 comorbidity: x3 c-sections  are also affecting patient's functional outcome.      REHAB POTENTIAL: Good   CLINICAL DECISION MAKING: Stable/uncomplicated  EVALUATION COMPLEXITY: Low     GOALS: Goals reviewed with patient? Yes   SHORT TERM GOALS: Target date: 08/04/2021   Pt to be I with HEP. Baseline: Goal status: MET   2.  Pt to report no more than 3/10 pain at pelvis due to improved mechanics with mobility and use of belly band for support.  Baseline:  Goal status: MET   3.  Pt to demonstrate good lifting techniques of at least 10# without compensatory strategies for child care without pelvic pain.  Baseline:  Goal status: MET       LONG TERM GOALS: Target date: 10/06/2021   Pt to be I with advanced HEP. Baseline:  Goal status: MET   2.  Pt to report no more than 2/10 pain at pelvis due to improved mechanics with mobility and use of belly band for support. Baseline:  Goal status: MET   3.  Pt to demonstrate at least 5/5 bil hip strength for improved pelvic stability and functional squats for pregnancy progression and child care.   Baseline:  Goal status: NOT MET      PLAN: PT FREQUENCY: 1x/week   PT DURATION:  5 sessions   PLANNED INTERVENTIONS: Therapeutic exercises, Therapeutic activity, Neuromuscular  re-education, Patient/Family education, Joint mobilization, Spinal mobilization, Cryotherapy, Moist heat, Manual lymph drainage, scar mobilization, and Manual therapy   PLAN FOR NEXT SESSION: stretching at hips and spine, breathing mechanics   PHYSICAL THERAPY DISCHARGE SUMMARY  Visits from Start of Care: 5  Current functional level related to goals / functional outcomes: All STG goals met but 2/3 LTG goals met   Remaining deficits: Does still have pain following long photo shots but improved overall and bil hip strength 4+/5   Education / Equipment: HEP   Patient agrees to discharge. Patient goals were partially met. Patient is being discharged due to being pleased with the current functional level.   Stacy Gardner, PT, DPT 08/18/2309:48 AM

## 2021-08-19 ENCOUNTER — Other Ambulatory Visit: Payer: Self-pay | Admitting: Family Medicine

## 2021-08-19 DIAGNOSIS — O34219 Maternal care for unspecified type scar from previous cesarean delivery: Secondary | ICD-10-CM

## 2021-08-24 ENCOUNTER — Other Ambulatory Visit: Payer: Self-pay

## 2021-08-24 ENCOUNTER — Ambulatory Visit (INDEPENDENT_AMBULATORY_CARE_PROVIDER_SITE_OTHER): Payer: BC Managed Care – PPO

## 2021-08-24 ENCOUNTER — Ambulatory Visit (INDEPENDENT_AMBULATORY_CARE_PROVIDER_SITE_OTHER): Payer: BC Managed Care – PPO | Admitting: *Deleted

## 2021-08-24 VITALS — BP 124/69 | HR 91 | Wt 191.0 lb

## 2021-08-24 DIAGNOSIS — Z3A35 35 weeks gestation of pregnancy: Secondary | ICD-10-CM

## 2021-08-24 DIAGNOSIS — O24414 Gestational diabetes mellitus in pregnancy, insulin controlled: Secondary | ICD-10-CM | POA: Diagnosis not present

## 2021-08-24 DIAGNOSIS — Z3480 Encounter for supervision of other normal pregnancy, unspecified trimester: Secondary | ICD-10-CM

## 2021-08-24 DIAGNOSIS — Z98891 History of uterine scar from previous surgery: Secondary | ICD-10-CM

## 2021-08-24 DIAGNOSIS — O24419 Gestational diabetes mellitus in pregnancy, unspecified control: Secondary | ICD-10-CM

## 2021-08-24 DIAGNOSIS — O24415 Gestational diabetes mellitus in pregnancy, controlled by oral hypoglycemic drugs: Secondary | ICD-10-CM

## 2021-08-24 MED ORDER — INSULIN GLARGINE-YFGN 100 UNIT/ML ~~LOC~~ SOPN: 6.0000 [IU] | PEN_INJECTOR | Freq: Every day | SUBCUTANEOUS | 0 refills | Status: DC

## 2021-08-24 MED ORDER — INSULIN PEN NEEDLE 29G X 5MM MISC: 30.0000 | Freq: Every day | 0 refills | Status: DC

## 2021-08-24 NOTE — Progress Notes (Cosign Needed)
NST-R.  Pt taught insulin injections and returned demonstration.

## 2021-08-24 NOTE — Progress Notes (Signed)
   PRENATAL VISIT NOTE  Subjective:  Catherine Wu is a 31 y.o. [phone] at [redacted]w[redacted]d being seen today for ongoing prenatal care.  She is currently monitored for the following issues for this high-risk pregnancy and has Hx of cesarean section complicating pregnancy; History of gestational hypertension; Supervision of other normal pregnancy, antepartum; History of cholestasis during pregnancy; and Gestational diabetes on their problem list.  Patient reports no complaints.  Contractions: Not present. Vag. Bleeding: None.  Movement: Present. Denies leaking of fluid.   The following portions of the patient's history were reviewed and updated as appropriate: allergies, current medications, past family history, past medical history, past social history, past surgical history and problem list.   Objective:   Vitals:   08/24/21 1548  BP: 124/69  Pulse: 91  Weight: 191 lb (86.6 kg)    Fetal Status:     Movement: Present     General:  Alert, oriented and cooperative. Patient is in no acute distress.  Skin: Skin is warm and dry. No rash noted.   Cardiovascular: Normal heart rate noted  Respiratory: Normal respiratory effort, no problems with respiration noted  Abdomen: Soft, gravid, appropriate for gestational age.  Pain/Pressure: Absent     Pelvic: Cervical exam deferred        Extremities: Normal range of motion.  Edema: None  Mental Status: Normal mood and affect. Normal behavior. Normal judgment and thought content.   Assessment and Plan:  Pregnancy: [id number] at [redacted]w[redacted]d 1. Supervision of other normal pregnancy, antepartum - Routine OB. Doing well, no concerns - NST reactive   2. Gestational diabetes mellitus (GDM) in third trimester controlled on oral hypoglycemic drug - Fasting blood sugars remain >100, despite increasing Metformin to 1000mg  at night. Postprandials within range - Recommend insulin at bedtime which patient is agreeable to taking - C/w Dr. who agrees with  recommendation. Will start at 6 units nightly - Reviewed diet and high protein snacks - insulin glargine-yfgn (SEMGLEE) 100 UNIT/ML Pen; Inject 6 Units into the skin at bedtime.  Dispense: 3 mL; Refill: 0  3. [redacted] weeks gestation of pregnancy - Endorses active fetal movement  4. History of 3 cesarean sections - Repeat scheduled   Preterm labor symptoms and general obstetric precautions including but not limited to vaginal bleeding, contractions, leaking of fluid and fetal movement were reviewed in detail with the patient. Please refer to After Visit Summary for other counseling recommendations.   Return in about 1 week (around 08/31/2021).  Future Appointments  Date Time Provider Department Center  08/31/2021  1:30 PM [facility] NURSE [facility]  08/31/2021  1:45 PM [facility]  08/31/2021  3:50 PM Rasch, 10/31/2021, NP [facility]    Harolyn Rutherford, CNM

## 2021-08-25 ENCOUNTER — Telehealth: Payer: Self-pay

## 2021-08-25 NOTE — Telephone Encounter (Signed)
Spoke with pharmacy earlier and they need to change pt's Rx Semglee to [employer] due to insurance reasons. I spoke with Camelia Eng, CNM and she approves this change. I called pharmacy back and left detailed voicemail giving approval to switch Semglee to [employer].

## 2021-08-31 ENCOUNTER — Ambulatory Visit (INDEPENDENT_AMBULATORY_CARE_PROVIDER_SITE_OTHER): Payer: BC Managed Care – PPO | Admitting: Obstetrics and Gynecology

## 2021-08-31 ENCOUNTER — Ambulatory Visit (HOSPITAL_BASED_OUTPATIENT_CLINIC_OR_DEPARTMENT_OTHER): Payer: BC Managed Care – PPO

## 2021-08-31 ENCOUNTER — Ambulatory Visit: Payer: BC Managed Care – PPO | Admitting: *Deleted

## 2021-08-31 ENCOUNTER — Other Ambulatory Visit (HOSPITAL_COMMUNITY)
Admission: RE | Admit: 2021-08-31 | Discharge: 2021-08-31 | Disposition: A | Discharge status: Home / Self Care | Payer: BC Managed Care – PPO | Source: Ambulatory Visit | Attending: Obstetrics and Gynecology | Admitting: Obstetrics and Gynecology

## 2021-08-31 VITALS — BP 122/63 | HR 86 | Wt 192.0 lb

## 2021-08-31 VITALS — BP 122/63 | HR 86

## 2021-08-31 DIAGNOSIS — O24414 Gestational diabetes mellitus in pregnancy, insulin controlled: Secondary | ICD-10-CM

## 2021-08-31 DIAGNOSIS — O358XX0 Maternal care for other (suspected) fetal abnormality and damage, not applicable or unspecified: Secondary | ICD-10-CM

## 2021-08-31 DIAGNOSIS — O09293 Supervision of pregnancy with other poor reproductive or obstetric history, third trimester: Secondary | ICD-10-CM | POA: Diagnosis not present

## 2021-08-31 DIAGNOSIS — O34219 Maternal care for unspecified type scar from previous cesarean delivery: Secondary | ICD-10-CM

## 2021-08-31 DIAGNOSIS — Z3480 Encounter for supervision of other normal pregnancy, unspecified trimester: Secondary | ICD-10-CM

## 2021-08-31 DIAGNOSIS — Z8759 Personal history of other complications of pregnancy, childbirth and the puerperium: Secondary | ICD-10-CM

## 2021-08-31 DIAGNOSIS — O24419 Gestational diabetes mellitus in pregnancy, unspecified control: Secondary | ICD-10-CM | POA: Insufficient documentation

## 2021-08-31 DIAGNOSIS — Z3A36 36 weeks gestation of pregnancy: Secondary | ICD-10-CM

## 2021-08-31 DIAGNOSIS — O24415 Gestational diabetes mellitus in pregnancy, controlled by oral hypoglycemic drugs: Secondary | ICD-10-CM

## 2021-08-31 NOTE — Progress Notes (Signed)
   PRENATAL VISIT NOTE  Subjective:  Catherine Wu is a 31 y.o. [phone] at [redacted]w[redacted]d being seen today for ongoing prenatal care.  She is currently monitored for the following issues for this low-risk pregnancy and has Hx of cesarean section complicating pregnancy; History of gestational hypertension; Supervision of other normal pregnancy, antepartum; History of cholestasis during pregnancy; Gestational diabetes; and [redacted] weeks gestation of pregnancy on their problem list.  Patient reports no complaints.  Contractions: Not present. Vag. Bleeding: None.  Movement: Present. Denies leaking of fluid.   The following portions of the patient's history were reviewed and updated as appropriate: allergies, current medications, past family history, past medical history, past social history, past surgical history and problem list.   Objective:   Vitals:   08/31/21 1549  BP: 122/63  Pulse: 86  Weight: 192 lb (87.1 kg)    Fetal Status: Fetal Heart Rate (bpm): 143 Fundal Height: 36 cm Movement: Present     General:  Alert, oriented and cooperative. Patient is in no acute distress.  Skin: Skin is warm and dry. No rash noted.   Cardiovascular: Normal heart rate noted  Respiratory: Normal respiratory effort, no problems with respiration noted  Abdomen: Soft, gravid, appropriate for gestational age.  Pain/Pressure: Absent     Pelvic: Cervical exam deferred        Extremities: Normal range of motion.  Edema: None  Mental Status: Normal mood and affect. Normal behavior. Normal judgment and thought content.   Assessment and Plan:  Pregnancy: L2X5170 at [redacted]w[redacted]d 1. Supervision of other normal pregnancy, antepartum  - Culture, Grp B Strep w/Rflx Suscept - Cervicovaginal ancillary only( [place])  2. [redacted] weeks gestation of pregnancy   3. Insulin controlled gestational diabetes mellitus (GDM) during pregnancy, antepartum  Started insulin at bedtime 6 uinits on 6/3. Blood sugars have been 95-105.  Will give a whole week to evaluate. Continue metformin daily. MFM [place] today shows 8/8 BPP, baby @ 2659 gm Per MFM, Ok for patient to start 2x weekly testing in the office.  C/s Scheduled.   Term labor symptoms and general obstetric precautions including but not limited to vaginal bleeding, contractions, leaking of fluid and fetal movement were reviewed in detail with the patient. Please refer to After Visit Summary for other counseling recommendations.   Return Start antenatal testing next week, Tuesdays and Fridays..  Future Appointments  Date Time Provider Department Center  09/07/2021  4:10 PM Armando Reichert, CNM [facility]    Venia Carbon, NP

## 2021-09-01 ENCOUNTER — Telehealth (HOSPITAL_COMMUNITY): Payer: Self-pay | Admitting: *Deleted

## 2021-09-01 ENCOUNTER — Encounter (HOSPITAL_COMMUNITY): Payer: Self-pay

## 2021-09-01 LAB — CERVICOVAGINAL ANCILLARY ONLY
Chlamydia: NEGATIVE
Comment: NEGATIVE
Comment: NORMAL
Neisseria Gonorrhea: NEGATIVE

## 2021-09-01 NOTE — Patient Instructions (Signed)
Catherine Wu  09/01/2021   Your procedure is scheduled on:  09/22/2021  Arrive at 58 at Entrance C on [address] at [facility]  and [address]. You are invited to use the FREE valet parking or use the Visitor's parking deck.  Pick up the phone at the desk and dial [phone].  Call this number if you have problems the morning of surgery: [phone]  Remember:   Do not eat food:(After Midnight) Desps de medianoche.  Do not drink clear liquids: (After Midnight) Desps de medianoche.  Take these medicines the morning of surgery with A SIP OF WATER:  Take half of the prescribed bedtime insulin the night before your surgery.  No medication the day of surgery.   Do not wear jewelry, make-up or nail polish.  Do not wear lotions, powders, or perfumes. Do not wear deodorant.  Do not shave 48 hours prior to surgery.  Do not bring valuables to the hospital.  [facility] is not   responsible for any belongings or valuables brought to the hospital.  Contacts, dentures or bridgework may not be worn into surgery.  Leave suitcase in the car. After surgery it may be brought to your room.  For patients admitted to the hospital, checkout time is 11:00 AM the day of              discharge.      Please read over the following fact sheets that you were given:     Preparing for Surgery

## 2021-09-01 NOTE — Telephone Encounter (Signed)
Preadmission screen  

## 2021-09-02 ENCOUNTER — Encounter (HOSPITAL_COMMUNITY): Payer: Self-pay

## 2021-09-02 ENCOUNTER — Telehealth (HOSPITAL_COMMUNITY): Payer: Self-pay | Admitting: *Deleted

## 2021-09-02 NOTE — Telephone Encounter (Signed)
Preadmission screen  

## 2021-09-03 LAB — CULTURE, STREPTOCOCCUS GRP B W/SUSCEPT
MICRO NUMBER:: 13489274
SPECIMEN QUALITY:: ADEQUATE

## 2021-09-07 ENCOUNTER — Ambulatory Visit (INDEPENDENT_AMBULATORY_CARE_PROVIDER_SITE_OTHER): Payer: BC Managed Care – PPO | Admitting: Advanced Practice Midwife

## 2021-09-07 ENCOUNTER — Ambulatory Visit (INDEPENDENT_AMBULATORY_CARE_PROVIDER_SITE_OTHER): Payer: BC Managed Care – PPO | Admitting: *Deleted

## 2021-09-07 ENCOUNTER — Encounter: Payer: Self-pay | Admitting: Advanced Practice Midwife

## 2021-09-07 VITALS — BP 127/79 | HR 87 | Wt 194.0 lb

## 2021-09-07 DIAGNOSIS — Z3A37 37 weeks gestation of pregnancy: Secondary | ICD-10-CM

## 2021-09-07 DIAGNOSIS — O24419 Gestational diabetes mellitus in pregnancy, unspecified control: Secondary | ICD-10-CM

## 2021-09-07 DIAGNOSIS — O0990 Supervision of high risk pregnancy, unspecified, unspecified trimester: Secondary | ICD-10-CM

## 2021-09-07 NOTE — Progress Notes (Cosign Needed)
Pt here for AFI which was NML @ 14.9cm and NST is reactive.  Pt also has ROB with H Hogan,CNM today.

## 2021-09-07 NOTE — Progress Notes (Signed)
   PRENATAL VISIT NOTE  Subjective:  Catherine Wu is a 31 y.o. [phone] at [redacted]w[redacted]d being seen today for ongoing prenatal care.  She is currently monitored for the following issues for this high-risk pregnancy and has Hx of cesarean section complicating pregnancy; History of gestational hypertension; Supervision of other normal pregnancy, antepartum; History of cholestasis during pregnancy; Gestational diabetes; and [redacted] weeks gestation of pregnancy on their problem list.  Patient reports no complaints.  Contractions: Not present. Vag. Bleeding: None.  Movement: Present. Denies leaking of fluid.   The following portions of the patient's history were reviewed and updated as appropriate: allergies, current medications, past family history, past medical history, past social history, past surgical history and problem list.   Objective:   Vitals:   09/07/21 1529  BP: 127/79  Pulse: 87  Weight: 194 lb (88 kg)    Fetal Status:     Movement: Present     General:  Alert, oriented and cooperative. Patient is in no acute distress.  Skin: Skin is warm and dry. No rash noted.   Cardiovascular: Normal heart rate noted  Respiratory: Normal respiratory effort, no problems with respiration noted  Abdomen: Soft, gravid, appropriate for gestational age.  Pain/Pressure: Absent     Pelvic: Cervical exam deferred        Extremities: Normal range of motion.  Edema: None  Mental Status: Normal mood and affect. Normal behavior. Normal judgment and thought content.    NST:  Baseline: 150 Variability: moderate Accels: 15x15 Decels: none Toco: non Reactive/Appropriate for GA  AFI= 14   Assessment and Plan:  Pregnancy: [id number] at [redacted]w[redacted]d 1. Supervision of high risk pregnancy, antepartum   2. [redacted] weeks gestation of pregnancy   3. Gestational diabetes, on insulin  Fasting: 106, 105. 114. 104. 105, 98. 102, 101, 97, 102 PP: 90, 112, 95, 100, 85, 110, 108, 90, 102, 90, 90, 90, 118, 110  Consult  with Dr. Harolyn Rutherford: will increase lantus to 8U at bedtime   Term labor symptoms and general obstetric precautions including but not limited to vaginal bleeding, contractions, leaking of fluid and fetal movement were reviewed in detail with the patient. Please refer to After Visit Summary for other counseling recommendations.   No follow-ups on file.  Future Appointments  Date Time Provider [place]  09/10/2021 10:30 AM [facility] NURSE [facility]  09/20/2021  9:00 AM [facility] PAT 1 [facility] None    Marcille Buffy DNP, CNM  09/07/21  4:23 PM

## 2021-09-10 ENCOUNTER — Ambulatory Visit (INDEPENDENT_AMBULATORY_CARE_PROVIDER_SITE_OTHER): Payer: BC Managed Care – PPO | Admitting: *Deleted

## 2021-09-10 ENCOUNTER — Encounter: Payer: Self-pay | Admitting: *Deleted

## 2021-09-10 VITALS — BP 130/80 | HR 105 | Wt 192.0 lb

## 2021-09-10 DIAGNOSIS — O24414 Gestational diabetes mellitus in pregnancy, insulin controlled: Secondary | ICD-10-CM | POA: Diagnosis not present

## 2021-09-10 DIAGNOSIS — Z3A37 37 weeks gestation of pregnancy: Secondary | ICD-10-CM | POA: Diagnosis not present

## 2021-09-10 DIAGNOSIS — Z3480 Encounter for supervision of other normal pregnancy, unspecified trimester: Secondary | ICD-10-CM | POA: Diagnosis not present

## 2021-09-10 NOTE — Progress Notes (Signed)
NST-Reactive and reviewed @ bedside by [facility]

## 2021-09-13 ENCOUNTER — Ambulatory Visit (INDEPENDENT_AMBULATORY_CARE_PROVIDER_SITE_OTHER): Payer: BC Managed Care – PPO | Admitting: Obstetrics & Gynecology

## 2021-09-13 VITALS — BP 118/77 | HR 90 | Wt 195.0 lb

## 2021-09-13 DIAGNOSIS — Z3483 Encounter for supervision of other normal pregnancy, third trimester: Secondary | ICD-10-CM

## 2021-09-13 DIAGNOSIS — Z3480 Encounter for supervision of other normal pregnancy, unspecified trimester: Secondary | ICD-10-CM | POA: Diagnosis not present

## 2021-09-13 DIAGNOSIS — O24414 Gestational diabetes mellitus in pregnancy, insulin controlled: Secondary | ICD-10-CM

## 2021-09-13 DIAGNOSIS — Z3A38 38 weeks gestation of pregnancy: Secondary | ICD-10-CM | POA: Diagnosis not present

## 2021-09-13 DIAGNOSIS — O34219 Maternal care for unspecified type scar from previous cesarean delivery: Secondary | ICD-10-CM

## 2021-09-13 NOTE — Progress Notes (Unsigned)
NST Reactive and AFI 12.42 CM

## 2021-09-13 NOTE — Progress Notes (Unsigned)
   PRENATAL VISIT NOTE  Subjective:  Catherine Wu is a 31 y.o. [phone] at [redacted]w[redacted]d being seen today for ongoing prenatal care.  She is currently monitored for the following issues for this {Blank single:19197::"high-risk","low-risk"} pregnancy and has Hx of cesarean section complicating pregnancy; History of gestational hypertension; Supervision of other normal pregnancy, antepartum; History of cholestasis during pregnancy; Gestational diabetes; and [redacted] weeks gestation of pregnancy on their problem list.  Patient reports {sx:14538}.  Contractions: Not present. Vag. Bleeding: None.  Movement: Present. Denies leaking of fluid.   The following portions of the patient's history were reviewed and updated as appropriate: allergies, current medications, past family history, past medical history, past social history, past surgical history and problem list.   Objective:   Vitals:   09/13/21 1445  BP: 118/77  Pulse: 90  Weight: 195 lb (88.5 kg)    Fetal Status: Fetal Heart Rate (bpm): NST   Movement: Present  Presentation: Vertex  General:  Alert, oriented and cooperative. Patient is in no acute distress.  Skin: Skin is warm and dry. No rash noted.   Cardiovascular: Normal heart rate noted  Respiratory: Normal respiratory effort, no problems with respiration noted  Abdomen: Soft, gravid, appropriate for gestational age.  Pain/Pressure: Absent     Pelvic: {Blank single:19197::"Cervical exam performed in the presence of a chaperone","Cervical exam deferred"}        Extremities: Normal range of motion.  Edema: None  Mental Status: Normal mood and affect. Normal behavior. Normal judgment and thought content.   Assessment and Plan:  Pregnancy: [id number] at [redacted]w[redacted]d 1. Supervision of other normal pregnancy, antepartum ***  {Blank single:19197::"Term","Preterm"} labor symptoms and general obstetric precautions including but not limited to vaginal bleeding, contractions, leaking of fluid and fetal  movement were reviewed in detail with the patient. Please refer to After Visit Summary for other counseling recommendations.   No follow-ups on file.  Future Appointments  Date Time Provider Department Center  09/16/2021  2:30 PM [facility] NURSE [facility]  09/20/2021  9:00 AM [facility] PAT 1 [facility] None  09/20/2021  1:50 PM Lesly Dukes, MD [facility]  09/20/2021  2:30 PM [facility]URSE [facility]    Elsie Lincoln, MD

## 2021-09-16 ENCOUNTER — Ambulatory Visit (INDEPENDENT_AMBULATORY_CARE_PROVIDER_SITE_OTHER): Payer: BC Managed Care – PPO | Admitting: *Deleted

## 2021-09-16 DIAGNOSIS — O24414 Gestational diabetes mellitus in pregnancy, insulin controlled: Secondary | ICD-10-CM

## 2021-09-16 NOTE — Progress Notes (Cosign Needed)
Pt here today for NST only.  NST is reactive and sent to Dr Earlene Plater for review.

## 2021-09-20 ENCOUNTER — Encounter (HOSPITAL_COMMUNITY)
Admission: RE | Admit: 2021-09-20 | Discharge: 2021-09-20 | Disposition: A | Discharge status: Home / Self Care | Payer: BC Managed Care – PPO | Source: Ambulatory Visit | Attending: Obstetrics | Admitting: Obstetrics

## 2021-09-20 ENCOUNTER — Ambulatory Visit (INDEPENDENT_AMBULATORY_CARE_PROVIDER_SITE_OTHER): Payer: BC Managed Care – PPO | Admitting: *Deleted

## 2021-09-20 ENCOUNTER — Ambulatory Visit (INDEPENDENT_AMBULATORY_CARE_PROVIDER_SITE_OTHER): Payer: BC Managed Care – PPO

## 2021-09-20 ENCOUNTER — Ambulatory Visit (INDEPENDENT_AMBULATORY_CARE_PROVIDER_SITE_OTHER): Payer: BC Managed Care – PPO | Admitting: Obstetrics & Gynecology

## 2021-09-20 VITALS — BP 118/74 | HR 90 | Wt 192.0 lb

## 2021-09-20 DIAGNOSIS — O24414 Gestational diabetes mellitus in pregnancy, insulin controlled: Secondary | ICD-10-CM | POA: Diagnosis not present

## 2021-09-20 DIAGNOSIS — Z8759 Personal history of other complications of pregnancy, childbirth and the puerperium: Secondary | ICD-10-CM

## 2021-09-20 DIAGNOSIS — O34219 Maternal care for unspecified type scar from previous cesarean delivery: Secondary | ICD-10-CM | POA: Insufficient documentation

## 2021-09-20 DIAGNOSIS — Z01812 Encounter for preprocedural laboratory examination: Secondary | ICD-10-CM | POA: Insufficient documentation

## 2021-09-20 DIAGNOSIS — Z3A00 Weeks of gestation of pregnancy not specified: Secondary | ICD-10-CM | POA: Insufficient documentation

## 2021-09-20 DIAGNOSIS — Z3480 Encounter for supervision of other normal pregnancy, unspecified trimester: Secondary | ICD-10-CM

## 2021-09-20 DIAGNOSIS — Z3A39 39 weeks gestation of pregnancy: Secondary | ICD-10-CM

## 2021-09-20 HISTORY — DX: Personal history of other diseases of the digestive system: Z87.19

## 2021-09-20 LAB — CBC
HCT: 32.8 % — ABNORMAL LOW (ref 36.0–46.0)
Hemoglobin: 10 g/dL — ABNORMAL LOW (ref 12.0–15.0)
MCH: 26 pg (ref 26.0–34.0)
MCHC: 30.5 g/dL (ref 30.0–36.0)
MCV: 85.2 fL (ref 80.0–100.0)
Platelets: 279 10*3/uL (ref 150–400)
RBC: 3.85 MIL/uL — ABNORMAL LOW (ref 3.87–5.11)
RDW: 15.8 % — ABNORMAL HIGH (ref 11.5–15.5)
WBC: 8.8 10*3/uL (ref 4.0–10.5)
nRBC: 0 % (ref 0.0–0.2)

## 2021-09-20 LAB — TYPE AND SCREEN
ABO/RH(D): A POS
Antibody Screen: NEGATIVE

## 2021-09-20 LAB — RAPID HIV SCREEN (HIV 1/2 AB+AG)
HIV 1/2 Antibodies: NONREACTIVE
HIV-1 P24 Antigen - HIV24: NONREACTIVE

## 2021-09-20 NOTE — Progress Notes (Cosign Needed)
NST/AFI normal and routed to Dr Penne Lash

## 2021-09-21 LAB — RPR: RPR Ser Ql: NONREACTIVE

## 2021-09-22 ENCOUNTER — Encounter (HOSPITAL_COMMUNITY): Payer: Self-pay | Admitting: Family Medicine

## 2021-09-22 ENCOUNTER — Inpatient Hospital Stay (HOSPITAL_COMMUNITY): Payer: BC Managed Care – PPO

## 2021-09-22 ENCOUNTER — Other Ambulatory Visit: Payer: Self-pay

## 2021-09-22 ENCOUNTER — Inpatient Hospital Stay (HOSPITAL_COMMUNITY): Payer: BC Managed Care – PPO | Admitting: Anesthesiology

## 2021-09-22 ENCOUNTER — Encounter (HOSPITAL_COMMUNITY)
Admission: RE | Disposition: A | Discharge status: Home / Self Care | Payer: Self-pay | Source: Home / Self Care | Attending: Family Medicine

## 2021-09-22 ENCOUNTER — Inpatient Hospital Stay (HOSPITAL_COMMUNITY)
Admission: RE | Admit: 2021-09-22 | Discharge: 2021-09-24 | DRG: 787 | Disposition: A | Discharge status: Home / Self Care | Payer: BC Managed Care – PPO | Attending: Family Medicine | Admitting: Family Medicine

## 2021-09-22 DIAGNOSIS — Z3A39 39 weeks gestation of pregnancy: Secondary | ICD-10-CM | POA: Diagnosis not present

## 2021-09-22 DIAGNOSIS — O26893 Other specified pregnancy related conditions, third trimester: Secondary | ICD-10-CM | POA: Diagnosis present

## 2021-09-22 DIAGNOSIS — O99892 Other specified diseases and conditions complicating childbirth: Principal | ICD-10-CM | POA: Diagnosis present

## 2021-09-22 DIAGNOSIS — Z3480 Encounter for supervision of other normal pregnancy, unspecified trimester: Secondary | ICD-10-CM

## 2021-09-22 DIAGNOSIS — O9081 Anemia of the puerperium: Secondary | ICD-10-CM | POA: Diagnosis not present

## 2021-09-22 DIAGNOSIS — Z880 Allergy status to penicillin: Secondary | ICD-10-CM | POA: Diagnosis not present

## 2021-09-22 DIAGNOSIS — Z8632 Personal history of gestational diabetes: Secondary | ICD-10-CM | POA: Diagnosis present

## 2021-09-22 DIAGNOSIS — O24424 Gestational diabetes mellitus in childbirth, insulin controlled: Secondary | ICD-10-CM | POA: Diagnosis present

## 2021-09-22 DIAGNOSIS — O34211 Maternal care for low transverse scar from previous cesarean delivery: Secondary | ICD-10-CM | POA: Diagnosis present

## 2021-09-22 DIAGNOSIS — D62 Acute posthemorrhagic anemia: Secondary | ICD-10-CM | POA: Diagnosis not present

## 2021-09-22 DIAGNOSIS — Z8759 Personal history of other complications of pregnancy, childbirth and the puerperium: Secondary | ICD-10-CM

## 2021-09-22 DIAGNOSIS — Z8719 Personal history of other diseases of the digestive system: Secondary | ICD-10-CM

## 2021-09-22 DIAGNOSIS — O24419 Gestational diabetes mellitus in pregnancy, unspecified control: Secondary | ICD-10-CM | POA: Diagnosis present

## 2021-09-22 DIAGNOSIS — Z98891 History of uterine scar from previous surgery: Secondary | ICD-10-CM

## 2021-09-22 DIAGNOSIS — O34219 Maternal care for unspecified type scar from previous cesarean delivery: Secondary | ICD-10-CM | POA: Diagnosis present

## 2021-09-22 LAB — GLUCOSE, CAPILLARY
Glucose-Capillary: 88 mg/dL (ref 70–99)
Glucose-Capillary: 109 mg/dL — ABNORMAL HIGH (ref 70–99)

## 2021-09-22 SURGERY — Surgical Case
Anesthesia: Spinal

## 2021-09-22 MED ORDER — ONDANSETRON HCL 4 MG/2ML IJ SOLN
INTRAMUSCULAR | Status: DC | PRN
  Administered 2021-09-22: 4 mg via INTRAVENOUS

## 2021-09-22 MED ORDER — MEPERIDINE HCL 25 MG/ML IJ SOLN: 6.2500 mg | INTRAMUSCULAR | Status: DC | PRN

## 2021-09-22 MED ORDER — SOD CITRATE-CITRIC ACID 500-334 MG/5ML PO SOLN
30.0000 mL | ORAL | Status: AC
  Administered 2021-09-22: 30 mL via ORAL

## 2021-09-22 MED ORDER — PHENYLEPHRINE 80 MCG/ML (10ML) SYRINGE FOR IV PUSH (FOR BLOOD PRESSURE SUPPORT)
PREFILLED_SYRINGE | INTRAVENOUS | Status: AC
  Filled 2021-09-22: qty 10

## 2021-09-22 MED ORDER — PHENYLEPHRINE HCL-NACL 20-0.9 MG/250ML-% IV SOLN
INTRAVENOUS | Status: AC
  Filled 2021-09-22: qty 250

## 2021-09-22 MED ORDER — SODIUM CHLORIDE 0.9% FLUSH: 3.0000 mL | INTRAVENOUS | Status: DC | PRN

## 2021-09-22 MED ORDER — FENTANYL CITRATE (PF) 100 MCG/2ML IJ SOLN
INTRAMUSCULAR | Status: AC
  Filled 2021-09-22: qty 2

## 2021-09-22 MED ORDER — LACTATED RINGERS IV SOLN
INTRAVENOUS | Status: DC
Start: 2021-09-22 — End: 2021-09-24

## 2021-09-22 MED ORDER — OXYTOCIN-SODIUM CHLORIDE 30-0.9 UT/500ML-% IV SOLN
INTRAVENOUS | Status: AC
  Filled 2021-09-22: qty 500

## 2021-09-22 MED ORDER — FENTANYL CITRATE (PF) 100 MCG/2ML IJ SOLN
25.0000 ug | INTRAMUSCULAR | Status: DC | PRN
  Administered 2021-09-22: 25 ug via INTRAVENOUS

## 2021-09-22 MED ORDER — ACETAMINOPHEN 10 MG/ML IV SOLN
INTRAVENOUS | Status: DC | PRN
  Administered 2021-09-22: 1000 mg via INTRAVENOUS

## 2021-09-22 MED ORDER — ACETAMINOPHEN 500 MG PO TABS: 1000.0000 mg | ORAL_TABLET | Freq: Four times a day (QID) | ORAL | Status: DC

## 2021-09-22 MED ORDER — KETOROLAC TROMETHAMINE 30 MG/ML IJ SOLN: 30.0000 mg | Freq: Four times a day (QID) | INTRAMUSCULAR | Status: AC | PRN

## 2021-09-22 MED ORDER — VANCOMYCIN HCL IN DEXTROSE 1-5 GM/200ML-% IV SOLN
1000.0000 mg | Freq: Once | INTRAVENOUS | Status: AC
  Administered 2021-09-22: 1000 mg via INTRAVENOUS
  Filled 2021-09-22: qty 200

## 2021-09-22 MED ORDER — GENTAMICIN SULFATE 40 MG/ML IJ SOLN
5.0000 mg/kg | INTRAVENOUS | Status: AC
  Administered 2021-09-22: 324.4 mg via INTRAVENOUS
  Filled 2021-09-22: qty 8

## 2021-09-22 MED ORDER — KETOROLAC TROMETHAMINE 30 MG/ML IJ SOLN
30.0000 mg | Freq: Four times a day (QID) | INTRAMUSCULAR | Status: AC | PRN
  Administered 2021-09-23: 30 mg via INTRAVENOUS

## 2021-09-22 MED ORDER — SIMETHICONE 80 MG PO CHEW
80.0000 mg | CHEWABLE_TABLET | ORAL | Status: DC | PRN
  Administered 2021-09-23 – 2021-09-24 (×2): 80 mg via ORAL
  Filled 2021-09-22: qty 1

## 2021-09-22 MED ORDER — DEXAMETHASONE SODIUM PHOSPHATE 10 MG/ML IJ SOLN
INTRAMUSCULAR | Status: AC
  Filled 2021-09-22: qty 1

## 2021-09-22 MED ORDER — BUPIVACAINE IN DEXTROSE 0.75-8.25 % IT SOLN
INTRATHECAL | Status: DC | PRN
  Administered 2021-09-22: 1.6 mL via INTRATHECAL

## 2021-09-22 MED ORDER — FENTANYL CITRATE (PF) 100 MCG/2ML IJ SOLN
INTRAMUSCULAR | Status: DC | PRN
  Administered 2021-09-22: 15 ug via INTRATHECAL

## 2021-09-22 MED ORDER — TETANUS-DIPHTH-ACELL PERTUSSIS 5-2.5-18.5 LF-MCG/0.5 IM SUSY: 0.5000 mL | PREFILLED_SYRINGE | Freq: Once | INTRAMUSCULAR | Status: DC

## 2021-09-22 MED ORDER — COCONUT OIL OIL
1.0000 | TOPICAL_OIL | Status: DC | PRN
Start: 2021-09-22 — End: 2021-09-24

## 2021-09-22 MED ORDER — ONDANSETRON HCL 4 MG/2ML IJ SOLN
INTRAMUSCULAR | Status: AC
  Filled 2021-09-22: qty 2

## 2021-09-22 MED ORDER — SIMETHICONE 80 MG PO CHEW
80.0000 mg | CHEWABLE_TABLET | Freq: Three times a day (TID) | ORAL | Status: DC
  Administered 2021-09-23 – 2021-09-24 (×3): 80 mg via ORAL
  Filled 2021-09-22 (×4): qty 1

## 2021-09-22 MED ORDER — PHENYLEPHRINE HCL (PRESSORS) 10 MG/ML IV SOLN
INTRAVENOUS | Status: DC | PRN
  Administered 2021-09-22: 80 ug via INTRAVENOUS

## 2021-09-22 MED ORDER — GABAPENTIN 100 MG PO CAPS
200.0000 mg | ORAL_CAPSULE | Freq: Every day | ORAL | Status: DC
  Administered 2021-09-22 – 2021-09-23 (×2): 200 mg via ORAL
  Filled 2021-09-22 (×2): qty 2

## 2021-09-22 MED ORDER — SCOPOLAMINE 1 MG/3DAYS TD PT72: 1.0000 | MEDICATED_PATCH | Freq: Once | TRANSDERMAL | Status: DC

## 2021-09-22 MED ORDER — SENNOSIDES-DOCUSATE SODIUM 8.6-50 MG PO TABS
2.0000 | ORAL_TABLET | Freq: Every day | ORAL | Status: DC
  Administered 2021-09-23 – 2021-09-24 (×2): 2 via ORAL
  Filled 2021-09-22 (×3): qty 2

## 2021-09-22 MED ORDER — ACETAMINOPHEN 10 MG/ML IV SOLN
INTRAVENOUS | Status: AC
  Filled 2021-09-22: qty 100

## 2021-09-22 MED ORDER — PHENYLEPHRINE HCL-NACL 20-0.9 MG/250ML-% IV SOLN
INTRAVENOUS | Status: DC | PRN
  Administered 2021-09-22: 60 ug/min via INTRAVENOUS

## 2021-09-22 MED ORDER — MORPHINE SULFATE (PF) 0.5 MG/ML IJ SOLN
INTRAMUSCULAR | Status: DC | PRN
  Administered 2021-09-22: .15 mg via INTRATHECAL

## 2021-09-22 MED ORDER — PRENATAL MULTIVITAMIN CH
1.0000 | ORAL_TABLET | Freq: Every day | ORAL | Status: DC
  Administered 2021-09-23: 1 via ORAL
  Filled 2021-09-22: qty 1

## 2021-09-22 MED ORDER — DIPHENHYDRAMINE HCL 50 MG/ML IJ SOLN: 12.5000 mg | INTRAMUSCULAR | Status: DC | PRN

## 2021-09-22 MED ORDER — NALOXONE HCL 4 MG/10ML IJ SOLN: 1.0000 ug/kg/h | INTRAVENOUS | Status: DC | PRN

## 2021-09-22 MED ORDER — ENOXAPARIN SODIUM 40 MG/0.4ML IJ SOSY
40.0000 mg | PREFILLED_SYRINGE | INTRAMUSCULAR | Status: DC
  Administered 2021-09-23: 40 mg via SUBCUTANEOUS
  Filled 2021-09-22 (×2): qty 0.4

## 2021-09-22 MED ORDER — OXYTOCIN-SODIUM CHLORIDE 30-0.9 UT/500ML-% IV SOLN: 2.5000 [IU]/h | INTRAVENOUS | Status: AC

## 2021-09-22 MED ORDER — DIBUCAINE (PERIANAL) 1 % EX OINT: 1.0000 | TOPICAL_OINTMENT | CUTANEOUS | Status: DC | PRN

## 2021-09-22 MED ORDER — KETOROLAC TROMETHAMINE 30 MG/ML IJ SOLN
30.0000 mg | Freq: Four times a day (QID) | INTRAMUSCULAR | Status: AC
  Administered 2021-09-22 – 2021-09-23 (×3): 30 mg via INTRAVENOUS
  Filled 2021-09-22 (×3): qty 1

## 2021-09-22 MED ORDER — OXYCODONE HCL 5 MG PO TABS
5.0000 mg | ORAL_TABLET | ORAL | Status: DC | PRN
  Administered 2021-09-23 – 2021-09-24 (×4): 10 mg via ORAL
  Filled 2021-09-22 (×4): qty 2

## 2021-09-22 MED ORDER — LACTATED RINGERS IV SOLN: INTRAVENOUS | Status: DC

## 2021-09-22 MED ORDER — KETOROLAC TROMETHAMINE 30 MG/ML IJ SOLN
INTRAMUSCULAR | Status: AC
  Filled 2021-09-22: qty 1

## 2021-09-22 MED ORDER — MAGNESIUM HYDROXIDE 400 MG/5ML PO SUSP: 30.0000 mL | ORAL | Status: DC | PRN

## 2021-09-22 MED ORDER — MENTHOL 3 MG MT LOZG: 1.0000 | LOZENGE | OROMUCOSAL | Status: DC | PRN

## 2021-09-22 MED ORDER — WITCH HAZEL-GLYCERIN EX PADS: 1.0000 | MEDICATED_PAD | CUTANEOUS | Status: DC | PRN

## 2021-09-22 MED ORDER — MORPHINE SULFATE (PF) 0.5 MG/ML IJ SOLN
INTRAMUSCULAR | Status: AC
  Filled 2021-09-22: qty 10

## 2021-09-22 MED ORDER — DIPHENHYDRAMINE HCL 25 MG PO CAPS: 25.0000 mg | ORAL_CAPSULE | Freq: Four times a day (QID) | ORAL | Status: DC | PRN

## 2021-09-22 MED ORDER — ONDANSETRON HCL 4 MG/2ML IJ SOLN: 4.0000 mg | Freq: Three times a day (TID) | INTRAMUSCULAR | Status: DC | PRN

## 2021-09-22 MED ORDER — ACETAMINOPHEN 500 MG PO TABS
1000.0000 mg | ORAL_TABLET | Freq: Four times a day (QID) | ORAL | Status: DC
  Administered 2021-09-22 – 2021-09-24 (×6): 1000 mg via ORAL
  Filled 2021-09-22 (×6): qty 2

## 2021-09-22 MED ORDER — DEXAMETHASONE SODIUM PHOSPHATE 10 MG/ML IJ SOLN
INTRAMUSCULAR | Status: DC | PRN
  Administered 2021-09-22: 10 mg via INTRAVENOUS

## 2021-09-22 MED ORDER — OXYTOCIN-SODIUM CHLORIDE 30-0.9 UT/500ML-% IV SOLN
INTRAVENOUS | Status: DC | PRN
  Administered 2021-09-22: 30 [IU] via INTRAVENOUS

## 2021-09-22 MED ORDER — MEDROXYPROGESTERONE ACETATE 150 MG/ML IM SUSP: 150.0000 mg | INTRAMUSCULAR | Status: DC | PRN

## 2021-09-22 MED ORDER — DIPHENHYDRAMINE HCL 25 MG PO CAPS
25.0000 mg | ORAL_CAPSULE | ORAL | Status: DC | PRN
  Administered 2021-09-23: 25 mg via ORAL
  Filled 2021-09-22: qty 1

## 2021-09-22 MED ORDER — IBUPROFEN 600 MG PO TABS
600.0000 mg | ORAL_TABLET | Freq: Four times a day (QID) | ORAL | Status: DC
  Administered 2021-09-23 – 2021-09-24 (×4): 600 mg via ORAL
  Filled 2021-09-22 (×4): qty 1

## 2021-09-22 MED ORDER — MEASLES, MUMPS & RUBELLA VAC IJ SOLR: 0.5000 mL | Freq: Once | INTRAMUSCULAR | Status: DC

## 2021-09-22 MED ORDER — SOD CITRATE-CITRIC ACID 500-334 MG/5ML PO SOLN
ORAL | Status: AC
  Filled 2021-09-22: qty 30

## 2021-09-22 MED ORDER — NALOXONE HCL 0.4 MG/ML IJ SOLN: 0.4000 mg | INTRAMUSCULAR | Status: DC | PRN

## 2021-09-22 SURGICAL SUPPLY — 26 items
BENZOIN TINCTURE PRP APPL 2/3 (GAUZE/BANDAGES/DRESSINGS) ×2 IMPLANT
CHLORAPREP W/TINT 26ML (MISCELLANEOUS) ×2 IMPLANT
CLAMP UMBILICAL CORD (MISCELLANEOUS) ×1 IMPLANT
CLOTH BEACON ORANGE TIMEOUT ST (SAFETY) ×2 IMPLANT
DRSG OPSITE POSTOP 4X10 (GAUZE/BANDAGES/DRESSINGS) ×2 IMPLANT
ELECT REM PT RETURN 9FT ADLT (ELECTROSURGICAL) ×2
ELECTRODE REM PT RTRN 9FT ADLT (ELECTROSURGICAL) ×1 IMPLANT
GLOVE BIOGEL PI IND STRL 7.0 (GLOVE) ×3 IMPLANT
GLOVE BIOGEL PI INDICATOR 7.0 (GLOVE) ×3
GLOVE ECLIPSE 6.5 STRL STRAW (GLOVE) ×2 IMPLANT
GOWN STRL REUS W/ TWL LRG LVL3 (GOWN DISPOSABLE) ×2 IMPLANT
GOWN STRL REUS W/TWL LRG LVL3 (GOWN DISPOSABLE) ×2
NS IRRIG 1000ML POUR BTL (IV SOLUTION) ×2 IMPLANT
PAD OB MATERNITY 4.3X12.25 (PERSONAL CARE ITEMS) ×2 IMPLANT
PAD PREP 24X48 CUFFED NSTRL (MISCELLANEOUS) ×2 IMPLANT
RETRACTOR WND ALEXIS 25 LRG (MISCELLANEOUS) IMPLANT
RTRCTR WOUND ALEXIS 25CM LRG (MISCELLANEOUS)
STRIP CLOSURE SKIN 1/2X4 (GAUZE/BANDAGES/DRESSINGS) ×2 IMPLANT
SUT PLAIN 2 0 XLH (SUTURE) ×2 IMPLANT
SUT VIC AB 0 CT1 36 (SUTURE) ×4 IMPLANT
SUT VIC AB 2-0 CT1 27 (SUTURE) ×1
SUT VIC AB 2-0 CT1 TAPERPNT 27 (SUTURE) ×1 IMPLANT
SUT VIC AB 4-0 KS 27 (SUTURE) ×2 IMPLANT
TOWEL OR 17X24 6PK STRL BLUE (TOWEL DISPOSABLE) ×6 IMPLANT
TRAY FOLEY CATH SILVER 16FR (SET/KITS/TRAYS/PACK) ×2 IMPLANT
WATER STERILE IRR 1000ML POUR (IV SOLUTION) ×2 IMPLANT

## 2021-09-22 NOTE — Anesthesia Preprocedure Evaluation (Deleted)
Anesthesia Evaluation  Patient identified by MRN, date of birth, ID band Patient awake    Reviewed: Allergy & Precautions, NPO status , Patient's Chart, lab work & pertinent test results  History of Anesthesia Complications (+) PONV and history of anesthetic complications  Airway Mallampati: II  TM Distance: >3 FB Neck ROM: Full    Dental  (+) Dental Advisory Given   Pulmonary    breath sounds clear to auscultation       Cardiovascular negative cardio ROS   Rhythm:Regular Rate:Normal     Neuro/Psych negative neurological ROS     GI/Hepatic negative GI ROS, Neg liver ROS,   Endo/Other  diabetes, Gestational  Renal/GU negative Renal ROS     Musculoskeletal   Abdominal   Peds  Hematology  (+) Blood dyscrasia, anemia ,   Anesthesia Other Findings   Reproductive/Obstetrics (+) Pregnancy                             Anesthesia Physical Anesthesia Plan  ASA: 2  Anesthesia Plan: Spinal   Post-op Pain Management:    Induction:   PONV Risk Score and Plan: 3 and Dexamethasone, Ondansetron and Scopolamine patch - Pre-op  Airway Management Planned: Natural Airway  Additional Equipment: None  Intra-op Plan:   Post-operative Plan:   Informed Consent: I have reviewed the patients History and Physical, chart, labs and discussed the procedure including the risks, benefits and alternatives for the proposed anesthesia with the patient or authorized representative who has indicated his/her understanding and acceptance.       Plan Discussed with: CRNA  Anesthesia Plan Comments:         Anesthesia Quick Evaluation

## 2021-09-22 NOTE — Anesthesia Procedure Notes (Signed)
Spinal  Patient location during procedure: OR Start time: 09/22/2021 8:42 AM End time: 09/22/2021 8:48 AM Reason for block: surgical anesthesia Staffing Anesthesiologist: Marcene Duos, MD Performed by: Marcene Duos, MD Authorized by: Marcene Duos, MD   Preanesthetic Checklist Completed: patient identified, IV checked, site marked, risks and benefits discussed, surgical consent, monitors and equipment checked, pre-op evaluation and timeout performed Spinal Block Patient position: sitting Prep: DuraPrep Patient monitoring: heart rate, cardiac monitor, continuous pulse ox and blood pressure Approach: midline Location: L4-5 Injection technique: single-shot Needle Needle type: Pencan  Needle gauge: 24 G Needle length: 9 cm Assessment Sensory level: T4 Events: CSF return Additional Notes Aspiration before and after injection LA. However, about halfway through injection syringe became partially disconnected from spinal needle and some of LA lost. Aspirated additional heavy bupivacaine from the tray and injected the rest of syringe into spinal needle with aspiration after injection noted. Pt with good levels and negative allis test prior to incision.

## 2021-09-22 NOTE — Lactation Note (Signed)
This note was copied from a baby's chart. Lactation Consultation Note Mother's RN to set up pump this evening. NICU [facility] team will f/u in the morning.  Patient Name: Catherine Wu Date: 09/22/2021   Age:31 hours   [name] 09/22/2021, 6:39 PM

## 2021-09-22 NOTE — Op Note (Signed)
Simmie Davies [facility] PROCEDURE DATE: 09/22/2021  PREOPERATIVE DIAGNOSIS: Intrauterine pregnancy at  [redacted]w[redacted]d weeks gestation;  previous uterine incision (3 previous C/S), A2GDM  POSTOPERATIVE DIAGNOSIS: The same  PROCEDURE: Repeat Low Transverse Cesarean Section  SURGEON:  Dr. Tinnie Gens  ASSISTANT:  Dr. Lyndel Safe  Dr. Leticia Penna   An experienced assistant was required given the standard of surgical care given the complexity of the case.  This assistant was needed for exposure, dissection, suctioning, retraction, instrument exchange, assisting with delivery with administration of fundal pressure, and for overall help during the procedure.  INDICATIONS: Catherine Wu is a 31 y.o. [phone] at [redacted]w[redacted]d scheduled for cesarean section secondary to  previous uterine incision .  The risks of cesarean section discussed with the patient included but were not limited to: bleeding which may require transfusion or reoperation; infection which may require antibiotics; injury to bowel, bladder, ureters or other surrounding organs; injury to the fetus; need for additional procedures including hysterectomy in the event of a life-threatening hemorrhage; placental abnormalities wth subsequent pregnancies, incisional problems, thromboembolic phenomenon and other postoperative/anesthesia complications. The patient concurred with the proposed plan, giving informed written consent for the procedure.    FINDINGS:  Viable female infant in cephalic presentation.  Apgars 8 and 9, weight 3380g  Clear amniotic fluid.  Intact placenta, three vessel cord.  Normal uterus, fallopian tubes and ovaries bilaterally. No adhesive disease. Minimal scar tissue present.   ANESTHESIA:  Spinal INTRAVENOUS FLUIDS: 1,400 ml ESTIMATED BLOOD LOSS: 716 ml URINE OUTPUT:  200 ml SPECIMENS: Placenta sent to L&D COMPLICATIONS: None immediate  PROCEDURE IN DETAIL:  The patient received intravenous antibiotics and had sequential compression  devices applied to her lower extremities while in the preoperative area.  She was then taken to the operating room where spinal anesthesia was administered and was found to be adequate. She was then placed in a dorsal supine position with a leftward tilt, and prepped and draped in a sterile manner.  A foley catheter was placed into her bladder and attached to constant gravity, which drained clear fluid throughout.  After an adequate timeout was performed, a Pfannenstiel skin incision was made with scalpel and carried through to the underlying layer of fascia. The fascia was incised in the midline and this incision was extended bilaterally bluntly. Kocher clamps were applied to the superior aspect of the fascial incision and the underlying rectus muscles were dissected off bluntly. The rectus muscles were separated in the midline bluntly and the peritoneum was entered bluntly. An Alexis retractor was placed to aid in visualization of the uterus.  Attention was turned to the lower uterine segment where a transverse hysterotomy was made with a scalpel and extended bilaterally bluntly. The infant was successfully delivered, and cord was clamped and cut and infant was handed over to awaiting neonatology team.   Uterine massage was then administered and the placenta delivered intact with three-vessel cord. The uterus was then cleared of clot and debris.  A one centimeter extension was noted underneath the hysterotomy and was incorporated into the hysterotomy closure. The hysterotomy was closed with 0 Vicryl in a unlocked single layer. Overall, excellent hemostasis was noted. The abdomen and the pelvis were cleared of all clot and debris and the Jon Gills was removed. Hemostasis was confirmed on all surfaces.  The peritoneum was reapproximated using 2-0 vicryl running stitches. The fascia was then closed using 0 Vicryl in a running fashion. The subcutaneous layer was reapproximated with plain gut and the skin  was closed  with 4-0 vicryl. The patient tolerated the procedure well. Sponge, lap, instrument and needle counts were correct x 2. She was taken to the recovery room in stable condition.    Allayne Stack, DO 09/22/2021 10:59 AM

## 2021-09-22 NOTE — Transfer of Care (Signed)
Immediate Anesthesia Transfer of Care Note  Patient: Catherine Wu  Procedure(s) Performed: CESAREAN SECTION  Patient Location: PACU  Anesthesia Type:Spinal  Level of Consciousness: awake, alert , oriented and patient cooperative  Airway & Oxygen Therapy: Patient Spontanous Breathing  Post-op Assessment: Report given to RN and Post -op Vital signs reviewed and stable  Post vital signs: Reviewed and stable  Last Vitals:  Vitals Value Taken Time  BP 111/72 09/22/21 1100  Temp    Pulse 86 09/22/21 1103  Resp 16 09/22/21 1103  SpO2 99 % 09/22/21 1103  Vitals shown include unvalidated device data.  Last Pain:  Vitals:   09/22/21 0740  TempSrc: Oral         Complications: No notable events documented.

## 2021-09-22 NOTE — Discharge Summary (Signed)
Postpartum Discharge Summary      Patient Name: Catherine Wu DOB: 08-27-1990 MRN: [id number]  Date of admission: 09/22/2021 Delivery date:09/22/2021  Delivering provider: Caren Macadam  Date of discharge: 09/24/2021  Admitting diagnosis: Cesarean delivery due to maternal disorder [O99.892] Previous cesarean delivery affecting pregnancy, antepartum [O34.219] Delivery of pregnancy by cesarean section [O82] Intrauterine pregnancy: [redacted]w[redacted]d    Secondary diagnosis:  Principal Problem:   S/P repeat low transverse C-section Active Problems:   Hx of cesarean section complicating pregnancy   History of gestational hypertension   Supervision of other normal pregnancy, antepartum   History of cholestasis during pregnancy   Gestational diabetes  Additional problems: None    Discharge diagnosis: Term Pregnancy Delivered                                              Post partum procedures: None Augmentation: N/A Complications: None  Hospital course: Sceduled C/S   31y.o. yo [id number] admitted to the hospital 09/22/2021 for scheduled cesarean section with the following indication: repeat C/S .Delivery details are as follows:  Membrane Rupture Time/Date: 10:15 AM ,09/22/2021   Delivery Method:C-Section, Low Transverse  Details of operation can be found in separate operative note.  Patient had an uncomplicated postpartum course.  She is ambulating, tolerating a regular diet, passing flatus, and urinating well. Patient is discharged home in stable condition on  09/24/21        Newborn Data: Birth date:09/22/2021  Birth time:10:16 AM  Gender:Female  Living status:Living  Apgars:8 ,9  Weight:3380 g     Magnesium Sulfate received: No BMZ received: No Rhophylac:No MMR:No T-DaP:Given prenatally Flu: No Transfusion:No  Physical exam  Vitals:   09/23/21 0016 09/23/21 0215 09/23/21 0514 09/24/21 0441  BP: 125/70 (!) 102/54 101/69 125/85  Pulse: 78 67 71 77  Resp:  '18 17 16 16  ' Temp:  97.8 F (36.6 C) 97.6 F (36.4 C) 98.8 F (37.1 C)  TempSrc:  Oral Oral Oral  SpO2:    97%  Weight:      Height:       General: alert Lochia: appropriate Uterine Fundus: firm Incision: Healing well with no significant drainage DVT Evaluation: No evidence of DVT seen on physical exam. Labs: Lab Results  Component Value Date   WBC 10.3 09/23/2021   HGB 8.7 (L) 09/23/2021   HCT 27.9 (L) 09/23/2021   MCV 84.3 09/23/2021   PLT 216 09/23/2021      Latest Ref Rng & Units 03/04/2021    1:58 PM  CMP  Glucose 65 - 99 mg/dL 90   BUN 7 - 25 mg/dL 7   Creatinine 0.50 - 0.96 mg/dL 0.55   Sodium 135 - 146 mmol/L 138   Potassium 3.5 - 5.3 mmol/L 4.1   Chloride 98 - 110 mmol/L 105   CO2 20 - 32 mmol/L 21   Calcium 8.6 - 10.2 mg/dL 8.4   Total Protein 6.1 - 8.1 g/dL 6.3   Total Bilirubin 0.2 - 1.2 mg/dL 0.6   AST 10 - 30 U/L 13   ALT 6 - 29 U/L 13    Edinburgh Score:    09/22/2021    4:45 PM  Edinburgh Postnatal Depression Scale Screening Tool  I have been able to laugh and see the funny side of things. 0  I  have looked forward with enjoyment to things. 0  I have blamed myself unnecessarily when things went wrong. 2  I have been anxious or worried for no good reason. 2  I have felt scared or panicky for no good reason. 1  Things have been getting on top of me. 1  I have been so unhappy that I have had difficulty sleeping. 0  I have felt sad or miserable. 1  I have been so unhappy that I have been crying. 1  The thought of harming myself has occurred to me. 0  Edinburgh Postnatal Depression Scale Total 8     After visit meds:  Allergies as of 09/24/2021       Reactions   Penicillins Other (See Comments)   Unknown childhood reaction. Did it involve swelling of the face/tongue/throat, SOB, or low BP? Unknown Did it involve sudden or severe rash/hives, skin peeling, or any reaction on the inside of your mouth or nose? Unknown Did you need to seek medical  attention at a hospital or doctor's office? Unknown When did it last happen?  Early childhood     If all above answers are "NO", may proceed with cephalosporin use. Unknown childhood reaction. Did it involve swelling of the face/tongue/throat, SOB, or low BP? Unknown Did it involve sudden or severe rash/hives, skin peeling, or any reaction on the inside of your mouth or nose? Unknown Did you need to seek medical attention at a hospital or doctor's office? Unknown When did it last happen?  Early childhood     If all above answers are "NO", may proceed with cephalosporin use.        Medication List     STOP taking these medications    aspirin EC 81 MG tablet   Basaglar KwikPen 100 UNIT/ML   Contour Next Test test strip Generic drug: glucose blood   Insulin Pen Needle 29G X 5MM Misc   Lancets Misc   metFORMIN 500 MG tablet Commonly known as: GLUCOPHAGE   Tresiba FlexTouch 100 UNIT/ML FlexTouch Pen Generic drug: insulin degludec       TAKE these medications    acetaminophen 500 MG tablet Commonly known as: TYLENOL Take 2 tablets (1,000 mg total) by mouth every 8 (eight) hours as needed (pain).   ibuprofen 600 MG tablet Commonly known as: ADVIL Take 1 tablet (600 mg total) by mouth every 6 (six) hours as needed (pain).   oxyCODONE 5 MG immediate release tablet Commonly known as: Oxy IR/ROXICODONE Take 1 tablet (5 mg total) by mouth every 6 (six) hours as needed for severe pain or breakthrough pain.   prenatal multivitamin Tabs tablet Take 1 tablet by mouth daily at 12 noon.         Discharge home in stable condition Infant Feeding: Breast Infant Disposition:NICU Discharge instruction: per After Visit Summary and Postpartum booklet. Activity: Advance as tolerated. Pelvic rest for 6 weeks.  Diet: routine diet Future Appointments:No future appointments. Follow up Visit:  Message sent to [facility] by Dr Higinio Plan:  Please schedule this patient for a In person  postpartum visit in 6 weeks with the following provider: Any provider. Additional Postpartum F/U:2 hour GTT and Incision check 1 week  High risk pregnancy complicated by: GDM Delivery mode:  C-Section, Low Transverse  Anticipated Birth Control:   vasectomy   Renard Matter, MD, MPH OB Fellow, Faculty Practice

## 2021-09-22 NOTE — Anesthesia Preprocedure Evaluation (Addendum)
Anesthesia Evaluation  Patient identified by MRN, date of birth, ID band Patient awake    Reviewed: Allergy & Precautions, NPO status , Patient's Chart, lab work & pertinent test results  History of Anesthesia Complications (+) PONV and history of anesthetic complications  Airway Mallampati: II  TM Distance: >3 FB Neck ROM: Full    Dental no notable dental hx. (+) Teeth Intact, Dental Advisory Given   Pulmonary neg pulmonary ROS,    Pulmonary exam normal breath sounds clear to auscultation       Cardiovascular negative cardio ROS Normal cardiovascular exam Rhythm:Regular Rate:Normal     Neuro/Psych negative neurological ROS     GI/Hepatic Neg liver ROS, GERD  Medicated and Controlled,Cholestasis of pregnancy- c/o pruritis and elevated bile acids    Endo/Other  diabetes, Gestational  Renal/GU negative Renal ROS  negative genitourinary   Musculoskeletal negative musculoskeletal ROS (+)   Abdominal Normal abdominal exam  (+)   Peds  Hematology  (+) Blood dyscrasia, anemia ,   Anesthesia Other Findings   Reproductive/Obstetrics (+) Pregnancy (Hx of C/S x3)                           Lab Results  Component Value Date   WBC 8.8 09/20/2021   HGB 10.0 (L) 09/20/2021   HCT 32.8 (L) 09/20/2021   MCV 85.2 09/20/2021   PLT 279 09/20/2021    Anesthesia Physical  Anesthesia Plan  ASA: 2  Anesthesia Plan: Spinal   Post-op Pain Management:    Induction:   PONV Risk Score and Plan: 4 or greater and Treatment may vary due to age or medical condition, Ondansetron, Dexamethasone, Scopolamine patch - Pre-op and Diphenhydramine  Airway Management Planned: Natural Airway  Additional Equipment: None  Intra-op Plan:   Post-operative Plan:   Informed Consent: I have reviewed the patients History and Physical, chart, labs and discussed the procedure including the risks, benefits and  alternatives for the proposed anesthesia with the patient or authorized representative who has indicated his/her understanding and acceptance.       Plan Discussed with: CRNA  Anesthesia Plan Comments:         Anesthesia Quick Evaluation

## 2021-09-22 NOTE — Anesthesia Postprocedure Evaluation (Signed)
Anesthesia Post Note  Patient: [name]  Procedure(s) Performed: CESAREAN SECTION     Patient location during evaluation: PACU Anesthesia Type: Spinal Level of consciousness: awake and alert Pain management: pain level controlled Vital Signs Assessment: post-procedure vital signs reviewed and stable Respiratory status: spontaneous breathing and respiratory function stable Cardiovascular status: blood pressure returned to baseline and stable Postop Assessment: spinal receding Anesthetic complications: no   No notable events documented.  Last Vitals:  Vitals:   09/22/21 1101 09/22/21 1200  BP:  107/76  Pulse: 86 68  Resp: 12 15  Temp:  (!) 36.4 C  SpO2: 98% 100%    Last Pain:  Vitals:   09/22/21 1200  TempSrc: Axillary  PainSc: 3    Pain Goal:                   [name]

## 2021-09-22 NOTE — H&P (Signed)
Obstetric Preoperative History and Physical  Catherine Wu is a 31 y.o. [phone] with IUP at [redacted]w[redacted]d presenting for presenting for scheduled cesarean section.  No acute concerns.   Prenatal Course Source of Care: [facility]  with onset of care at 10 weeks Pregnancy complications or risks: Patient Active Problem List   Diagnosis Date Noted   S/P repeat low transverse C-section 09/22/2021   [redacted] weeks gestation of pregnancy 08/31/2021   Gestational diabetes 06/24/2021   History of cholestasis during pregnancy 03/04/2021   Supervision of other normal pregnancy, antepartum 03/01/2021   Hx of cesarean section complicating pregnancy 11/13/2018   History of gestational hypertension 11/13/2018   She plans to breastfeed She Vasectomy== partner is going to have. Patient declined BTS at time of CS today after extensive discussion. She is sure about not wanting more children.  Prenatal labs and studies: ABO, Rh: --/--/A POS (06/26 9983) Antibody: NEG (06/26 0922) Rubella: 0.99 (12/08 1358) RPR: NON REACTIVE (06/26 0923)  HBsAg: NON-REACTIVE (12/08 1358)  HIV: NON REACTIVE (06/26 0923)  GBS: Negative 2 hr Glucola  FAILED Genetic screening normal Anatomy US normal  Prenatal Transfer Tool  Maternal Diabetes: Yes:  Diabetes Type:  Insulin/Medication controlled Genetic Screening: Normal Maternal Ultrasounds/Referrals: Normal Fetal Ultrasounds or other Referrals:  None Maternal Substance Abuse:  No Significant Maternal Medications:  Meds include: Other:  Insulin Significant Maternal Lab Results: Group B Strep negative  Past Medical History:  Diagnosis Date   History of cholestasis during pregnancy    PONV (postoperative nausea and vomiting)     Past Surgical History:  Procedure Laterality Date   CESAREAN SECTION     CESAREAN SECTION N/A 05/28/2019   Procedure: CESAREAN SECTION;  Surgeon: Levie Heritage, DO;  Location: [facility] LD [facility]S;  Service: Obstetrics;  Laterality: N/A;    OB History  Gravida  Para Term Preterm AB Living  6 3 3   2 3   SAB IAB Ectopic Multiple Live Births  2     0 3    # Outcome Date GA Lbr Len/2nd Weight Sex Delivery Anes PTL Lv  6 Current           5 Term 05/28/19 [redacted]w[redacted]d  4179 g M CS-LTranv Spinal  LIV  4 SAB           3 SAB           2 Term      CS-LTranv     1 Term      CS-LTranv       Social History   Socioeconomic History   Marital status: Married    Spouse name: [redacted]w[redacted]d   Number of children: Not on file   Years of education: Not on file   Highest education level: Not on file  Occupational History   Occupation: homemaker  Tobacco Use   Smoking status: Never   Smokeless tobacco: Never  Vaping Use   Vaping Use: Never used  Substance and Sexual Activity   Alcohol use: Never   Drug use: Never   Sexual activity: Yes    Partners: Male    Birth control/protection: None  Other Topics Concern   Not on file  Social History Narrative   Not on file   Social Determinants of Health   Financial Resource Strain: Not on file  Food Insecurity: No Food Insecurity (06/30/2021)   Hunger Vital Sign    Worried About Running Out of Food in the Last Year: Never true  Ran Out of Food in the Last Year: Never true  Transportation Needs: Not on file  Physical Activity: Not on file  Stress: Not on file  Social Connections: Not on file    Family History  Problem Relation Age of Onset   Kidney disease Maternal Grandmother    Kidney disease Maternal Grandfather        dialysis   Heart disease Maternal Grandfather    Thyroid disease Paternal Grandmother     Medications Prior to Admission  Medication Sig Dispense Refill Last Dose   aspirin EC 81 MG tablet Take 1 tablet (81 mg total) by mouth daily. Swallow whole. (Patient taking differently: Take 81 mg by mouth at bedtime. Swallow whole.) 30 tablet 11 Past Week   insulin degludec (TRESIBA FLEXTOUCH) 100 UNIT/ML FlexTouch Pen Inject 6 Units into the skin at bedtime. 3 mL 0 09/21/2021   Insulin Glargine  (BASAGLAR KWIKPEN) 100 UNIT/ML Inject 10 Units into the skin at bedtime.      metFORMIN (GLUCOPHAGE) 500 MG tablet Take 2 tablets (1,000 mg total) by mouth at bedtime. 60 tablet 1 09/21/2021   glucose blood (CONTOUR NEXT TEST) test strip Use as instructed 100 each 3    Insulin Pen Needle 29G X MISC 30 Devices by Does not apply route at bedtime. 1 each 0    Lancets MISC 1 Device by Does not apply route in the morning, at noon, in the evening, and at bedtime. 100 each 3    Prenatal Vit-Fe Fumarate-FA (PRENATAL MULTIVITAMIN) TABS tablet Take 1 tablet by mouth daily at 12 noon.   More than a month    Allergies  Allergen Reactions   Penicillins Other (See Comments)    Unknown childhood reaction. Did it involve swelling of the face/tongue/throat, SOB, or low BP? Unknown Did it involve sudden or severe rash/hives, skin peeling, or any reaction on the inside of your mouth or nose? Unknown Did you need to seek medical attention at a hospital or doctor's office? Unknown When did it last happen?  Early childhood     If all above answers are "NO", may proceed with cephalosporin use.  Unknown childhood reaction. Did it involve swelling of the face/tongue/throat, SOB, or low BP? Unknown Did it involve sudden or severe rash/hives, skin peeling, or any reaction on the inside of your mouth or nose? Unknown Did you need to seek medical attention at a hospital or doctor's office? Unknown When did it last happen?  Early childhood     If all above answers are "NO", may proceed with cephalosporin use.    Review of Systems: Negative except for what is mentioned in HPI.  Physical Exam: BP 132/88   Pulse 92   Temp 98.2 F (36.8 C)   Resp 18   Ht 5\' 2"  (1.575 m)   Wt 86.3 kg   LMP 12/21/2020   SpO2 100%   BMI 34.81 kg/m  FHR by Doppler:  182 bpm CONSTITUTIONAL: Well-developed, well-nourished female in no acute distress.  HENT:  Normocephalic, atraumatic, External right and left ear normal.  Oropharynx is clear and moist EYES: Conjunctivae and EOM are normal. Pupils are equal, round, and reactive to light. No scleral icterus.  NECK: Normal range of motion, supple, no masses SKIN: Skin is warm and dry. No rash noted. Not diaphoretic. No erythema. No pallor. NEUROLGIC: Alert and oriented to person, place, and time. Normal reflexes, muscle tone coordination. No cranial nerve deficit noted. PSYCHIATRIC: Normal mood and affect. Normal behavior.  Normal judgment and thought content. CARDIOVASCULAR: Normal heart rate noted, regular rhythm RESPIRATORY: Effort and breath sounds normal, no problems with respiration noted ABDOMEN: Soft, nontender, nondistended, gravid. Well-healed Pfannenstiel incision. PELVIC: Deferred MUSCULOSKELETAL: Normal range of motion. No edema and no tenderness. 2+ distal pulses.   Pertinent Labs/Studies:   Results for orders placed or performed during the hospital encounter of 09/22/21 (from the past 72 hour(s))  Glucose, capillary     Status: None   Collection Time: 09/22/21  8:33 AM  Result Value Ref Range   Glucose-Capillary 88 70 - 99 mg/dL    Comment: Glucose reference range applies only to samples taken after fasting for at least 8 hours.    Assessment and Plan :OLUWATOBI RUPPE is a 31 y.o. [phone] at [redacted]w[redacted]d being admitted being admitted for scheduled cesarean section. The risks of cesarean section discussed with the patient included but were not limited to: bleeding which may require transfusion or reoperation; infection which may require antibiotics; injury to bowel, bladder, ureters or other surrounding organs; injury to the fetus; need for additional procedures including hysterectomy in the event of a life-threatening hemorrhage; placental abnormalities wth subsequent pregnancies, incisional problems, thromboembolic phenomenon and other postoperative/anesthesia complications. The patient concurred with the proposed plan, giving informed written consent for  the procedure. Patient has been NPO since last night she will remain NPO for procedure. Anesthesia and OR aware. Preoperative prophylactic antibiotics and SCDs ordered on call to the OR. To OR when ready.   Having 4th CS today- discussed higher risks associated with high numbers of CS including placental abnormalities. Posterior placenta this pregnancy  OP note from 2021 does not note extensive adhesive disease  Federico Flake, MD, MPH, ABFM Attending Physician Center for [facility]

## 2021-09-23 ENCOUNTER — Encounter (HOSPITAL_COMMUNITY): Payer: Self-pay | Admitting: Family Medicine

## 2021-09-23 LAB — CBC
HCT: 27.9 % — ABNORMAL LOW (ref 36.0–46.0)
Hemoglobin: 8.7 g/dL — ABNORMAL LOW (ref 12.0–15.0)
MCH: 26.3 pg (ref 26.0–34.0)
MCHC: 31.2 g/dL (ref 30.0–36.0)
MCV: 84.3 fL (ref 80.0–100.0)
Platelets: 216 10*3/uL (ref 150–400)
RBC: 3.31 MIL/uL — ABNORMAL LOW (ref 3.87–5.11)
RDW: 15.7 % — ABNORMAL HIGH (ref 11.5–15.5)
WBC: 10.3 10*3/uL (ref 4.0–10.5)
nRBC: 0 % (ref 0.0–0.2)

## 2021-09-23 LAB — BIRTH TISSUE RECOVERY COLLECTION (PLACENTA DONATION)

## 2021-09-23 LAB — GLUCOSE, CAPILLARY: Glucose-Capillary: 139 mg/dL — ABNORMAL HIGH (ref 70–99)

## 2021-09-23 MED ORDER — SODIUM CHLORIDE 0.9 % IV SOLN
500.0000 mg | Freq: Once | INTRAVENOUS | Status: AC
  Administered 2021-09-23: 500 mg via INTRAVENOUS
  Filled 2021-09-23: qty 25

## 2021-09-23 NOTE — Progress Notes (Signed)
Patient screened out for psychosocial assessment since none of the following apply: Psychosocial stressors documented in mother or baby's chart Gestation less than 32 weeks Code at delivery  Infant with anomalies Please contact the Clinical Social Worker if specific needs arise or by MOB's request.  MOB Edinburgh Score is 8.  Blaine Hamper, MSW, LCSW Clinical Social Work [phone]

## 2021-09-23 NOTE — Progress Notes (Signed)
Pt in NICU awaiting provider rounds, will come back to unit for iron infusion and noon meds after provider rounds on her baby.

## 2021-09-23 NOTE — Progress Notes (Addendum)
POSTPARTUM PROGRESS NOTE  Subjective: Catherine Wu is a 31 y.o. [id number] s/p rLTCS at [redacted]w[redacted]d.  She reports she doing well. No acute events overnight. She denies any problems with ambulating, voiding or po intake. She has passed flatus. Pain is well controlled.  Lochia is improving. Infant in NICU for feeding issues.  Objective: Blood pressure 101/69, pulse 71, temperature 97.6 F (36.4 C), temperature source Oral, resp. rate 16, height 5\' 2"  (1.575 m), weight 86.3 kg, last menstrual period 12/21/2020, SpO2 99 %, unknown if currently breastfeeding.  Physical Exam:  General: alert, cooperative and no distress Chest: no respiratory distress Abdomen: soft, non-tender  Incision: honeycomb clean and dry Uterine Fundus: firm and at level of umbilicus Extremities: No calf swelling or tenderness  no edema  Recent Labs    09/20/21 0923 09/23/21 0511  HGB 10.0* 8.7*  HCT 32.8* 27.9*    Assessment/Plan: Catherine Wu is a 31 y.o. 26 s/p LTCS at [redacted]w[redacted]d.  Routine Postpartum Care: Doing well, pain well-controlled.  -- Continue routine care, lactation support  -- Contraception: Vasectomy -- Feeding: Breast  #Anemia Patient post op Hgb 8.7 today, down from 10.0 yesterday. Patient asymptomatic, but would benefit from IV iron infusion. -IV Venofer 500 mg  Dispo: Plan for discharge home tomorrow or on POD#3, depending on infant's status in NICU.  [redacted]w[redacted]d, MD Faculty Practice, Center for [facility] 09/23/2021 8:57 AM  CNM attestation Post Partum Day #1 I have seen and examined this patient and agree with above documentation in the resident's note.   Catherine Wu is a 31 y.o. 26 s/p rLTCS.  Pt denies problems with ambulating, voiding or po intake. Pain is well controlled.  Plan for birth control is vasectomy.  Method of Feeding: going to start pumping today  PE:  BP 125/85 (BP Location: Left Arm)   Pulse 77   Temp 98.8 F (37.1 C) (Oral)   Resp 16   Ht  5\' 2"  (1.575 m)   Wt 86.3 kg   LMP 12/21/2020   SpO2 97%   Breastfeeding Unknown   BMI 34.81 kg/m  Fundus firm Honeycomb dsg intact and dry  CBG this morning (not fasting): 139  Plan for discharge:  Begin pumping breasts today Venofer for acute blood loss anemia Will get fasting CBG 6/30 D/C TBD day #2/#3 according to her preference/infant status in NICU  12/23/2020, CNM 11:12 AM

## 2021-09-23 NOTE — Progress Notes (Incomplete Revision)
POSTPARTUM PROGRESS NOTE  Subjective: Catherine Wu is a 31 y.o. [id number] s/p LTCS at [redacted]w[redacted]d.  She reports she doing well. No acute events overnight. She denies any problems with ambulating, voiding or po intake. She has passed flatus. Pain is well controlled.  Lochia is improving.  Objective: Blood pressure 101/69, pulse 71, temperature 97.6 F (36.4 C), temperature source Oral, resp. rate 16, height 5\' 2"  (1.575 m), weight 86.3 kg, last menstrual period 12/21/2020, SpO2 99 %, unknown if currently breastfeeding.  Physical Exam:  General: alert, cooperative and no distress Chest: no respiratory distress Abdomen: soft, non-tender  Uterine Fundus: firm and at level of umbilicus Extremities: No calf swelling or tenderness  no edema  Recent Labs    09/20/21 0923 09/23/21 0511  HGB 10.0* 8.7*  HCT 32.8* 27.9*    Assessment/Plan: Catherine Wu is a 31 y.o. 26 s/p LTCS at [redacted]w[redacted]d.  Routine Postpartum Care: Doing well, pain well-controlled.  -- Continue routine care, lactation support  -- Contraception: Vasectomy -- Feeding: Breast  #Anemia Patient post op Hgb 8.7 today, down from 10.0 yesterday. Patient asymptomatic, but would benefit from IV iron infusion. -IV Venofer 500 mg  Dispo: Plan for discharge home tomorrow.  [redacted]w[redacted]d, MD Faculty Practice, Center for [facility] 09/23/2021 8:57 AM

## 2021-09-23 NOTE — Lactation Note (Signed)
This note was copied from a baby's chart.  NICU Lactation Consultation Note  Patient Name: Girl Jahnay Lantier [id number] Date: 09/23/2021 Age:31 hours   Subjective Reason for consult: Initial assessment; NICU baby; Term  Lactation conducted an initial appointment with Ms. Koerner. Baby is now 31 hours old. I asked Ms. Ortlieb if she would like to pump, and she verbalized that she would. I brought a DEBP kit into the room and initiated her first pumping session. Colostrum noted.   Mom states that her feeding plan is flexible. She states that she had difficulty with pumping and breastfeeding her previous children and states that they all had lip and/or tongue-ties. Her plan this time is to offer the breast/breastfeed as able, but she is open to bottle feeding as well if that expedites baby's discharge from the NICU.  I provided education on the first days of milk production and importance of frequent stimulation, oral care, etc. I offered to assist with breastfeeding in the NICU and invited her to page me as needed for that.   Follow up on Patient's personal pump status is needed.  Objective Infant data: Mother's Current Feeding Choice: Breast Milk and Donor Milk  Infant feeding assessment Scale for Readiness: 1 Scale for Quality: 4  Maternal data: [id number]  C-Section, Low Transverse Current breast feeding challenges:: NICU  Previous breastfeeding challenges?: Other (Comment); Low milk supply (Difficulty latching; lack of transfer with previous babies at the breast, perceived tongue-tie, etc.; difficulty letting down to a breast pump)  Does the patient have breastfeeding experience prior to this delivery?: Yes How long did the patient breastfeed?: variable amounts  Pumping frequency: recommended q3 hours Pumped volume: 3 mL  No data recorded  Pump:  (Need to ask)  Assessment  Maternal: Milk volume: Normal   Intervention/Plan Interventions: Breast feeding basics reviewed;  Education; Hand pump; DEBP; Hand express  Tools: Pump Pump Education: Setup, frequency, and cleaning; Milk Storage  Plan: Consult Status: NICU follow-up  NICU Follow-up type: New admission follow up    Lenore Manner 09/23/2021, 9:40 AM

## 2021-09-23 NOTE — Lactation Note (Signed)
This note was copied from a baby's chart.  NICU Lactation Consultation Note  Patient Name: Catherine Wu [id number] Date: 09/23/2021 Age:31 hours   Subjective Reason for consult: Follow-up assessment; RN request; NICU baby; Term  RN requested lactation to follow up in baby's room. Baby Catherine Wu is now ad lib breast and bottle feeding. Ms. Catherine Wu positioned baby in cross cradle hold on the left breast. Baby appeared to be latched with good flanging and seal; however I noted non-nutritive suckling patterns. I showed parents how to gently "pester" baby to feed more actively and how to switch breasts in a feeding when baby has finished on one side.  I encouraged parents to continue to augment breastfeeding with supplement after to ensure baby is receiving adequate volumes. I educated on day 2/night 2 infant feeding patterns and recommended that mom rest this afternoon, as able, in preparation of cluster feeding.  Objective Infant data: Mother's Current Feeding Choice: Breast Milk and Donor Milk  Infant feeding assessment Scale for Readiness: 2 Scale for Quality: 3    Maternal data: [id number]  C-Section, Low Transverse Current breast feeding challenges:: NICU  Previous breastfeeding challenges?: Other (Comment); Low milk supply (Difficulty latching; lack of transfer with previous babies at the breast, perceived tongue-tie, etc.; difficulty letting down to a breast pump)  Does the patient have breastfeeding experience prior to this delivery?: Yes How long did the patient breastfeed?: variable amounts  Pumping frequency: recommended q3 hours Pumped volume: 3 mL   Pump:  (Need to ask)  Assessment Infant: LATCH Score: 7   Maternal: Milk volume: Normal   Intervention/Plan Interventions: Breast feeding basics reviewed; Skin to skin; Hand express; Support pillows; Education  Tools: Pump Pump Education: Setup, frequency, and cleaning; Milk Storage  Plan: Consult Status:  NICU follow-up  NICU Follow-up type: New admission follow up    Catherine Wu 09/23/2021, 4:26 PM

## 2021-09-24 ENCOUNTER — Telehealth: Payer: Self-pay | Admitting: *Deleted

## 2021-09-24 LAB — GLUCOSE, CAPILLARY: Glucose-Capillary: 86 mg/dL (ref 70–99)

## 2021-09-24 MED ORDER — ACETAMINOPHEN 500 MG PO TABS: 1000.0000 mg | ORAL_TABLET | Freq: Three times a day (TID) | ORAL | 0 refills | Status: DC | PRN

## 2021-09-24 MED ORDER — OXYCODONE HCL 5 MG PO TABS
5.0000 mg | ORAL_TABLET | Freq: Four times a day (QID) | ORAL | 0 refills | Status: DC | PRN
Start: 2021-09-24 — End: 2021-11-04

## 2021-09-24 MED ORDER — IBUPROFEN 600 MG PO TABS: 600.0000 mg | ORAL_TABLET | Freq: Four times a day (QID) | ORAL | 0 refills | Status: DC | PRN

## 2021-09-24 NOTE — Progress Notes (Signed)
Post Operative Day 2  Subjective: Doing well. No acute events overnight. Pain is controlled and bleeding is appropriate. She is eating, drinking, voiding, and ambulating without issue. She is bottle feeding which is going well. She has no other concerns at this time.  Objective: Blood pressure 125/85, pulse 77, temperature 98.8 F (37.1 C), temperature source Oral, resp. rate 16, height 5\' 2"  (1.575 m), weight 86.3 kg, last menstrual period 12/21/2020, SpO2 97 %, unknown if currently breastfeeding.  Physical Exam:  General: alert, cooperative, and no distress Lochia: appropriate Uterine Fundus: firm and below umbilicus  Incision: healing well, no significant drainage, dressing is clean, dry, and intact  DVT Evaluation: no LE edema or calf tenderness to palpation   Recent Labs    09/23/21 0511  HGB 8.7*  HCT 27.9*    Assessment/Plan: Catherine Wu is a 31 y.o. 26 on POD# 2 s/p rLTCS.  Progressing well. Meeting postpartum milestones. VSS. Continue routine postpartum care.  #Acute blood loss anemia: - S/p IV Venofer - Asymptomatic  - Will continue to monitor   #A2GDM: - Fasting CBG 86 this AM - Plan for GTT outpatient   Feeding: Bottle  Contraception: Partner vasectomy   Dispo: Plan for discharge on POD#3 per patient preference. Can discharge later today if desired pending status of infant in NICU.   LOS: 2 days   [id number], MD  09/24/2021, 9:19 AM

## 2021-09-24 NOTE — Telephone Encounter (Signed)
-----   Message from Allayne Stack, DO sent at 09/22/2021  2:46 PM EDT ----- Regarding: Postpartum Please schedule this patient for a In person postpartum visit in 6 weeks with the following provider: Any provider. Additional Postpartum F/U:2 hour GTT and Incision check 1 week  High risk pregnancy complicated by: GDM Delivery mode: C-Section, Low Transverse  Anticipated Birth Control:  vasectomy

## 2021-09-24 NOTE — Lactation Note (Signed)
This note was copied from a baby's chart.  NICU Lactation Consultation Note  Patient Name: Catherine Wu [id number] Date: 09/24/2021 Age:31 hours   Subjective Reason for consult: Follow-up assessment Mother continues to pump but has not yet experienced breast changes at 50 hours pp. She denies breast pain or difficulty with pumping. Mother is offering breast as infant demonstrates interest. Infant will likely d/c today. Mother plans to f/u with a lactation consultant in the community.   Objective Infant data: Mother's Current Feeding Choice: Breast Milk and Donor Milk  Infant feeding assessment Scale for Readiness: 1 Scale for Quality: 2    Maternal data: [id number]  C-Section, Low Transverse No data recorded Current breast feeding challenges:: NICU  Previous breastfeeding challenges?: Other (Comment); Low milk supply (Difficulty latching; lack of transfer with previous babies at the breast, perceived tongue-tie, etc.; difficulty letting down to a breast pump)  Does the patient have breastfeeding experience prior to this delivery?: Yes How long did the patient breastfeed?: variable amounts  Pumping frequency: q3h Pumped volume: 5 mL   Pump:  (Need to ask)  Assessment Infant: LATCH Score: 8  Feeding Status: Ad lib   Maternal: Milk volume: Normal   Intervention/Plan Interventions: Education  Tools: Pump Pump Education: Setup, frequency, and cleaning; Milk Storage  Plan: Consult Status: NICU follow-up  NICU Follow-up type: New admission follow up    [name] 09/24/2021, 1:39 PM

## 2021-09-24 NOTE — Telephone Encounter (Signed)
Left patient an urgent message to call the office to schedule appointments.

## 2021-09-28 ENCOUNTER — Emergency Department: Admit: 2021-09-28 | Discharge status: Absent without leave | Payer: Self-pay

## 2021-09-28 ENCOUNTER — Emergency Department (INDEPENDENT_AMBULATORY_CARE_PROVIDER_SITE_OTHER)
Admission: EM | Admit: 2021-09-28 | Discharge: 2021-09-28 | Disposition: A | Discharge status: Home / Self Care | Payer: BC Managed Care – PPO | Source: Home / Self Care | Attending: Family Medicine | Admitting: Family Medicine

## 2021-09-28 ENCOUNTER — Encounter: Payer: Self-pay | Admitting: Emergency Medicine

## 2021-09-28 DIAGNOSIS — I808 Phlebitis and thrombophlebitis of other sites: Secondary | ICD-10-CM

## 2021-09-28 NOTE — ED Provider Notes (Signed)
Catherine Wu CARE    CSN: [id number] Arrival date & time: 09/28/21  1720      History   Chief Complaint Chief Complaint  Patient presents with   Arm Pain    right    HPI Catherine Wu is a 31 y.o. female.   HPI  This is a pleasant 31 year old woman.  She just had her fourth baby and was sent home from her cesarean section few days ago.  She has noticed pain and swelling in her right side of her IV.  She had an iron infusion and when the IV was removed they noted that she had a "knot" at the place.  It swollen and tender.  It is begun to worry her.  She would like to have it evaluated.  No chest pain or shortness of breath.  Baby is doing well.  Nursing well.  She has some neonatal jaundice  Past Medical History:  Diagnosis Date   History of cholestasis during pregnancy    PONV (postoperative nausea and vomiting)     Patient Active Problem List   Diagnosis Date Noted   S/P repeat low transverse C-section 09/22/2021   Previous cesarean delivery affecting pregnancy, antepartum 09/22/2021   Gestational diabetes 06/24/2021   History of cholestasis during pregnancy 03/04/2021   Supervision of other normal pregnancy, antepartum 03/01/2021   Hx of cesarean section complicating pregnancy 11/13/2018   History of gestational hypertension 11/13/2018    Past Surgical History:  Procedure Laterality Date   CESAREAN SECTION     CESAREAN SECTION N/A 05/28/2019   Procedure: CESAREAN SECTION;  Surgeon: Levie Heritage, DO;  Location: [facility];  Service: Obstetrics;  Laterality: N/A;   CESAREAN SECTION N/A 09/22/2021   Procedure: CESAREAN SECTION;  Surgeon: Reva Bores, MD;  Location: [facility];  Service: Obstetrics;  Laterality: N/A;    OB History     Gravida  6   Para  4   Term  4   Preterm      AB  2   Living  4      SAB  2   IAB      Ectopic      Multiple  0   Live Births  4            Home Medications    Prior to Admission medications    Medication Sig Start Date End Date Taking? Authorizing Provider  ibuprofen (ADVIL) 600 MG tablet Take 1 tablet (600 mg total) by mouth every 6 (six) hours as needed (pain). 09/24/21  Yes Warner Mccreedy, MD  acetaminophen (TYLENOL) 500 MG tablet Take 2 tablets (1,000 mg total) by mouth every 8 (eight) hours as needed (pain). 09/24/21   Warner Mccreedy, MD  oxyCODONE (OXY IR/ROXICODONE) 5 MG immediate release tablet Take 1 tablet (5 mg total) by mouth every 6 (six) hours as needed for severe pain or breakthrough pain. 09/24/21   Warner Mccreedy, MD  Prenatal Vit-Fe Fumarate-FA (PRENATAL MULTIVITAMIN) TABS tablet Take 1 tablet by mouth daily at 12 noon.    [provider]    Family History Family History  Problem Relation Age of Onset   Healthy Mother    Healthy Father    Kidney disease Maternal Grandmother    Kidney disease Maternal Grandfather        dialysis   Heart disease Maternal Grandfather    Thyroid disease Paternal Grandmother     Social History Social History   Tobacco  Use   Smoking status: Never   Smokeless tobacco: Never  Vaping Use   Vaping Use: Never used  Substance Use Topics   Alcohol use: Never   Drug use: Never     Allergies   Penicillins   Review of Systems Review of Systems See HPI  Physical Exam Triage Vital Signs ED Triage Vitals  Enc Vitals Group     BP 09/28/21 1730 122/85     Pulse Rate 09/28/21 1730 92     Resp 09/28/21 1730 15     Temp 09/28/21 1730 99.4 F (37.4 C)     Temp Source 09/28/21 1730 Oral     SpO2 09/28/21 1730 98 %     Weight 09/28/21 1733 180 lb 12.4 oz (82 kg)     Height 09/28/21 1733 5\' 2"  (1.575 m)     Head Circumference --      Peak Flow --      Pain Score 09/28/21 1732 2     Pain Loc --      Pain Edu? --      Excl. in GC? --    No data found.  Updated Vital Signs     Physical Exam Constitutional:      General: She is not in acute distress.    Appearance: She is well-developed.  HENT:     Head: Normocephalic  and atraumatic.  Eyes:     Conjunctiva/sclera: Conjunctivae normal.     Pupils: Pupils are equal, round, and reactive to light.  Cardiovascular:     Rate and Rhythm: Normal rate.  Pulmonary:     Effort: Pulmonary effort is normal. No respiratory distress.  Abdominal:     General: There is no distension.     Palpations: Abdomen is soft.  Musculoskeletal:        General: Normal range of motion.     Cervical back: Normal range of motion.  Skin:    General: Skin is warm and dry.     Comments: The right forearm, on the dorsum over the radial aspect about mid arm there is a palpable induration.  No bruising is seen.  There is a small area of cord.  It is tender.  The circumference of the right forearm is slightly larger than the left.  No tenderness of the deep palpation in the antecubital fossa or bicep region.  No swelling, sensation loss, or abnormality from the carpal region on down.  Carpal pulses are full.  Neurological:     Mental Status: She is alert.      UC Treatments / Results  Labs (all labs ordered are listed, but only abnormal results are displayed) Labs Reviewed - No data to display  EKG   Radiology No results found.  Procedures Procedures (including critical care time)  Medications Ordered in UC Medications - No data to display  Initial Impression / Assessment and Plan / UC Course  I have reviewed the triage vital signs and the nursing notes.  Pertinent labs & imaging results that were available during my care of the patient were reviewed by me and considered in my medical decision making (see chart for details).     Reviewed superficial phlebitis.  Reassured Final Clinical Impressions(s) / UC Diagnoses   Final diagnoses:  Superficial phlebitis of arm     Discharge Instructions      May take ibuprofen 600-800 with food up tp 3 x a day Warm compresses will help Follow up as scheduled on the  6th   ED Prescriptions   None    PDMP not reviewed  this encounter.   Eustace Moore, MD 09/28/21 [phone]

## 2021-09-28 NOTE — ED Triage Notes (Addendum)
Right arm pain started yesterday  Pt had an iron infusion on Thursday -post partum Pt states she had a lump where the IV was when they removed it  Swelling started today  Bruised area where IV was in the right  FA Here w/ husband & newborn daughter

## 2021-09-28 NOTE — Discharge Instructions (Addendum)
May take ibuprofen 600-800 with food up tp 3 x a day Warm compresses will help Follow up as scheduled on the 6th

## 2021-09-30 ENCOUNTER — Ambulatory Visit (INDEPENDENT_AMBULATORY_CARE_PROVIDER_SITE_OTHER): Payer: BC Managed Care – PPO | Admitting: *Deleted

## 2021-09-30 VITALS — BP 124/82 | HR 75 | Resp 16 | Wt 184.0 lb

## 2021-09-30 DIAGNOSIS — Z4889 Encounter for other specified surgical aftercare: Secondary | ICD-10-CM

## 2021-09-30 NOTE — Progress Notes (Signed)
I have reviewed this chart and agree with the RN/CMA assessment and management.    K. Meryl Yaritzy Huser, MD, FACOG [facility]ending [facility]  

## 2021-09-30 NOTE — Progress Notes (Signed)
Patient seen and assessed by nursing staff.  Agree with documentation and plan.  

## 2021-09-30 NOTE — Progress Notes (Cosign Needed)
Pt is here for 1 week post c-section incision check.  Pt had removed her honeycomb.  The incision is dry and intact with steri strips in place.  Pt is ambulating without effort.  She will return for PP visit or PRN.

## 2021-11-04 ENCOUNTER — Ambulatory Visit (INDEPENDENT_AMBULATORY_CARE_PROVIDER_SITE_OTHER): Payer: BC Managed Care – PPO | Admitting: Obstetrics and Gynecology

## 2021-11-04 ENCOUNTER — Encounter: Payer: Self-pay | Admitting: Obstetrics and Gynecology

## 2021-11-04 VITALS — BP 109/72 | HR 69 | Ht 62.0 in | Wt 185.0 lb

## 2021-11-04 DIAGNOSIS — L905 Scar conditions and fibrosis of skin: Secondary | ICD-10-CM

## 2021-11-04 NOTE — Progress Notes (Signed)
Post Partum Visit Note  Catherine Wu is a 31 y.o. [phone] female who presents for a postpartum visit. She is 6 weeks postpartum following a repeat cesarean section.  I have fully reviewed the prenatal and intrapartum course. The delivery was at 39.2 gestational weeks.  Anesthesia: spinal. Postpartum course has been unremarkable. Baby is doing well. Baby is feeding by breast. Bleeding no bleeding. Bowel function is normal. Bladder function is normal. Patient is sexually active. Contraception method is vasectomy. Postpartum depression screening: negative.   The pregnancy intention screening data noted above was reviewed. Potential methods of contraception were discussed. The patient elected to proceed with No data recorded.   Edinburgh Postnatal Depression Scale - 11/04/21 0839       Edinburgh Postnatal Depression Scale:  In the Past 7 Days   I have been able to laugh and see the funny side of things. 0    I have looked forward with enjoyment to things. 0    I have blamed myself unnecessarily when things went wrong. 2    I have been anxious or worried for no good reason. 2    I have felt scared or panicky for no good reason. 2    Things have been getting on top of me. 1    I have been so unhappy that I have had difficulty sleeping. 0    I have felt sad or miserable. 1    I have been so unhappy that I have been crying. 1    The thought of harming myself has occurred to me. 0    Edinburgh Postnatal Depression Scale Total 9             Health Maintenance Due  Topic Date Due   COVID-19 Vaccine (1) Never done   URINE MICROALBUMIN  Never done   TETANUS/TDAP  Never done   INFLUENZA VACCINE  10/26/2021    The following portions of the patient's history were reviewed and updated as appropriate: allergies, current medications, past family history, past medical history, past social history, past surgical history, and problem list.  Review of Systems Pertinent items are noted in  HPI.  Objective:  BP 109/72   Pulse 69   Ht 5\' 2"  (1.575 m)   Wt 185 lb (83.9 kg)   Breastfeeding Yes   BMI 33.84 kg/m    General:  alert, cooperative, and no distress   Breasts:  not indicated  Lungs: Normal effort  Heart:  Regular rate  Abdomen: normal findings: soft, non-tender   Wound well approximated incision  GU exam:  not indicated       Assessment:   6wk postpartum exam.   Plan:   Essential components of care per ACOG recommendations:  1.  Mood and well being: Patient with negative depression screening today. Reviewed local resources for support.  - Patient tobacco use? No.   - hx of drug use? No.    2. Infant care and feeding:  -Patient currently breastmilk feeding? Yes. Reviewed importance of draining breast regularly to support lactation.  -Social determinants of health (SDOH) reviewed in EPIC. No concerns  3. Sexuality, contraception and birth spacing - Patient does not want a pregnancy in the next year.  Desired family size is 4 children.  - Reviewed reproductive life planning. Reviewed contraceptive methods based on pt preferences and effectiveness.  Patient desired Vasectomy today.   - Discussed birth spacing of 18 months  4. Sleep and fatigue -Encouraged family/partner/community support of  4 hrs of uninterrupted sleep to help with mood and fatigue  5. Physical Recovery  - Discussed patients delivery and complications. She describes her experience as good. - Patient had a C-section.  - Patient has urinary incontinence? No. - Patient is safe to resume physical and sexual activity  6.  Health Maintenance - HM due items addressed Yes - Last pap smear  Diagnosis  Date Value Ref Range Status  05/15/2019   Final   - Negative for intraepithelial lesion or malignancy (NILM)   Pap smear not done at today's visit.  -Breast Cancer screening indicated? No.   7. Chronic Disease/Pregnancy Condition follow up: Gestational Diabetes - she will do this at 12  weeks.  She would like PT for scar massage etc, and also work on lower abdominal muscles following her 4th section.   - PCP follow up  Milas Hock, MD [facility], [facility]

## 2021-11-22 ENCOUNTER — Ambulatory Visit: Payer: BC Managed Care – PPO | Attending: Obstetrics and Gynecology | Admitting: Physical Therapy

## 2021-11-22 DIAGNOSIS — L905 Scar conditions and fibrosis of skin: Secondary | ICD-10-CM | POA: Diagnosis not present

## 2021-11-22 DIAGNOSIS — R293 Abnormal posture: Secondary | ICD-10-CM | POA: Diagnosis present

## 2021-11-22 DIAGNOSIS — R279 Unspecified lack of coordination: Secondary | ICD-10-CM | POA: Diagnosis present

## 2021-11-22 DIAGNOSIS — M6281 Muscle weakness (generalized): Secondary | ICD-10-CM | POA: Diagnosis present

## 2021-11-22 NOTE — Therapy (Signed)
OUTPATIENT PHYSICAL THERAPY TREATMENT NOTE   Patient Name: Catherine Wu MRN: [id number] DOB:July 16, 1990, 31 y.o., female Today's Date: 11/22/2021  PCP: Patient, No Pcp Per REFERRING PROVIDER: Milas Hock, MD   Past Medical History:  Diagnosis Date   History of cholestasis during pregnancy    PONV (postoperative nausea and vomiting)    Past Surgical History:  Procedure Laterality Date   CESAREAN SECTION     CESAREAN SECTION N/A 05/28/2019   Procedure: CESAREAN SECTION;  Surgeon: Levie Heritage, DO;  Location: [facility];  Service: Obstetrics;  Laterality: N/A;   CESAREAN SECTION N/A 09/22/2021   Procedure: CESAREAN SECTION;  Surgeon: Reva Bores, MD;  Location: [facility];  Service: Obstetrics;  Laterality: N/A;   Patient Active Problem List   Diagnosis Date Noted   Gestational diabetes 06/24/2021    REFERRING DIAG: O26.899,R10.2 (ICD-10-CM) - Pelvic pain in pregnancy  THERAPY DIAG:  Muscle weakness (generalized)  Abnormal posture  Unspecified lack of coordination  PERTINENT HISTORY: [id number], HIV antibody, high-risk pregnancy,  Hx of cesarean section complicating pregnancy; History of gestational hypertension; Supervision of other normal pregnancy, antepartum; and History of cholestasis during pregnancy on their problem list. X4 c-sections Sexual abuse: No  PRECAUTIONS: Postpartum   SUBJECTIVE: Pain during pregnancy resolved, no longer having bleeding postpartum. Pt here for core and hip strengthening post-partum and c-section scar mobility.   PAIN:  Are you having pain?No      WEIGHT BEARING RESTRICTIONS No   FALLS:  Has patient fallen in last 6 months? No   LIVING ENVIRONMENT: Lives with: lives with their family Lives in: House/apartment     OCCUPATION: Environmental manager part time   PLOF: Independent   PATIENT GOALS  less urine leakage with laughing a lot, mild constipation, strength post partum  BOWEL MOVEMENT Pain with bowel movement: No Type of  bowel movement:Type (Bristol Stool Scale) 4, Frequency 1-2x per day, and Strain Yes Fully empty rectum: No Leakage: No Pads: No Fiber supplement: No   URINATION Pain with urination: No Fully empty bladder: Yes:   Stream: Strong Urgency: No Frequency: worsening with pregnancy but prior to pregnancy had no concerns with this  Leakage: Coughing, Sneezing, Laughing, Lifting, Bending forward, and Intercourse Pads: No   INTERCOURSE Pain with intercourse:  Yes with penetration, mild deep penetration, has had some dryness per pt  Ability to have vaginal penetration:  Yes:   Climax: No pain Marinoff Scale: 0/3   PREGNANCY Vaginal deliveries 0 Tearing No C-section deliveries 4 Currently pregnant No    PROLAPSE None     OBJECTIVE:    DIAGNOSTIC FINDINGS:      COGNITION:            Overall cognitive status: Within functional limits for tasks assessed                          SENSATION:            Light touch: Appears intact            Proprioception: Appears intact   MUSCLE LENGTH: Bil hamstrings and adductors limited by 25%    POSTURE:  Rounded shoulders, anterior pelvic tilt   LUMBARAROM/PROM   WFL however does report and demonstrate stiffness in bil rotation and side bending   LE ROM: Bil WFL   LE MMT:   Bil hip abduction 3+/5; all other 4/5   PELVIC MMT: YES  No emotional/communication barriers or cognitive limitation.  Patient is motivated to learn. Patient understands and agrees with treatment goals and plan. PT explains patient will be examined in standing, sitting, and lying down to see how their muscles and joints work. When they are ready, they will be asked to remove their underwear so PT can examine their perineum. The patient is also given the option of providing their own chaperone as one is not provided in our facility. The patient also has the right and is explained the right to defer or refuse any part of the evaluation or treatment including the  internal exam. With the patient's consent, PT will use one gloved finger to gently assess the muscles of the pelvic floor, seeing how well it contracts and relaxes and if there is muscle symmetry. After, the patient will get dressed and PT and patient will discuss exam findings and plan of care. PT and patient discuss plan of care, schedule, attendance policy and HEP activities.    MMT   07/07/2021  Vaginal  4/5; 6s isometric; 5 reps  Internal Anal Sphincter    External Anal Sphincter    Puborectalis    Diastasis Recti 2 finger separation for 1.5 inches above umbilicus at first knuckle depth though does have muscle activation. 1 finger width separation below umbilicus   (Blank rows = not tested)         PALPATION:   General mild TTP at and around c-section scar with decreased mobility noted                 External Perineal Exam no TTP                             Internal Pelvic Floor bil obturator internus, pubococcygeus TTP   TONE: Slightly increased    PROLAPSE: Not seen in hooklying with cough    SPINAL MOBILITY:  All directions WFL though pt reports some tightness at upper back with breastfeeding postures and stiffness in general.   HIP STRENGTH:  Grossly hips 4+/5, knees and ankles 5/5  TODAY'S TREATMENT   Examination completed, findings reviewed, pt educated on POC, HEP, and lubricants and c-section scar mobility. Pt motivated to participate in PT and agreeable to attempt recommendations.     PATIENT EDUCATION:  Education details: [facility] Education method: Explanation, Demonstration, Tactile cues, Verbal cues, and Handouts Education comprehension: verbalized understanding and returned demonstration     HOME EXERCISE PROGRAM: [id number]   ASSESSMENT:   CLINICAL IMPRESSION: Patient presents postpartum today, noted core and hip bil weakness, mild inconsistent urinary leakage with laughing only. Pt found to have decreased core and bil hip strength, does have DRA  separation, decreased mobility at c-section scar in all directions, mild TTP at palpation of scar site. Pt consented to internal vaginal assessment this date and found to have decreased strength, coordination, and endurance, findings above and did have TTP at bil pubococcygeus and obturators. Pt would benefit from additional PT to further address deficits.       OBJECTIVE IMPAIRMENTS decreased coordination, decreased endurance, decreased mobility, decreased strength, increased fascial restrictions, increased muscle spasms, impaired flexibility, improper body mechanics, postural dysfunction, and pain.    ACTIVITY LIMITATIONS community activity.    PERSONAL FACTORS Fitness and 1 comorbidity: x3 c-sections  are also affecting patient's functional outcome.      REHAB POTENTIAL: Good   CLINICAL DECISION MAKING: Stable/uncomplicated   EVALUATION COMPLEXITY: Low     GOALS: Goals reviewed with patient? Yes  SHORT TERM GOALS: Target date: 12/20/21   Pt to be I with HEP. Baseline: Goal status: NEW   2.  Pt to report no more than 1/10 pain during intercourse for improved tolerance to vaginal penetration.  Baseline:  Goal status: NEW   3.  Pt to demonstrate good lifting techniques of at least 15# without compensatory strategies for child care without pelvic pain or leakage.  Baseline:  Goal status: NEW       LONG TERM GOALS: Target date: 02/22/22   Pt to be I with advanced HEP. Baseline:  Goal status: NEW   2.  Pt to demonstrate at least 5/5 bil hip strength for improved pelvic stability and functional squats without pain or compensatory strategies.   Baseline:  Goal status: NEW   3.  Pt to demonstrate improved core and breathing coordination consistently with compensatory strategies while lifting 25# for improved pelvic stability and core strength for child care.    Baseline:  Goal status: NEW     PLAN: PT FREQUENCY: 1x/week   PT DURATION: 8 sessions   PLANNED  INTERVENTIONS: Therapeutic exercises, Therapeutic activity, Neuromuscular re-education, Patient/Family education, Joint mobilization, Spinal mobilization, Cryotherapy, Moist heat, Manual lymph drainage, scar mobilization, and Manual therapy   PLAN FOR NEXT SESSION: core and hip strengthening, breathing mechanics, feminine moisturizers if needed     Otelia Sergeant, PT, DPT 11/23/2310:21 PM

## 2021-11-22 NOTE — Patient Instructions (Signed)
  Lubrication Used for intercourse to reduce friction Avoid ones that have glycerin, nonoxynol-9, petroleum, propylene glycol, chlorhexidine gluconate, warming gels, tingling gels, icing or cooling gel, scented Avoid parabens due to a preservative similar to female sex hormone May need to be reapplied once or several times during sexual activity Can be applied to both partners genitals prior to vaginal penetration to minimize friction or irritation Prevent irritation and mucosal tears that cause post coital pain and increased the risk of vaginal and urinary tract infections Oil-based lubricants cannot be used with condoms due to breaking them down.  Least likely to irritate vaginal tissue.  Plant based-lubes are safe Silicone-based lubrication are thicker and last long and used for post-menopausal women  Vaginal Lubricators Here is a list of some suggested lubricators you can use for intercourse. Use the most hypoallergenic product.  You can place on you or your partner.  Slippery Stuff ( water based) Sylk or Sliquid Natural H2O ( good  if frequent UTI's)- [employer], amazon Sliquid organics silk-(aloe and silicone based ) Blossom Organics (www.blossom-organics.com)- (aloe based ) Coconut oil, olive oil -not good with condoms  PJur Woman Nude- (water based) amazon Uberlube- ( silicon) Amazon Aloe Vera- Sprouts has an organic one Yes lubricant- (water based and has plant oil based similar to silicone) Amazon Wet Platinum-Silicone, [employer], [facility] Olive and Bee intimate cream-  www.oliveandbee.com.au Pink - Amazon Wet stuff Erosense Sync- [employer], amazon Coconu- coconu.com Desert Harvest  Things to avoid in lubricants are glycerin, warming gels, tingling gels, icing or cooling  gels, and scented gels.  Also avoid Vaseline. KY jelly,  and Astroglide contain chlorhexidine which kills good bacteria(lactobacilli)  Things to avoid in the vaginal area Do not use things to irritate the  vulvar area No lotions- see below Soaps you  can use :Aveeno, Calendula, Good Clean Love cleanser if needed. Must be gentle No deodorants No douches Good to sleep without underwear to let the vaginal area to air out No scrubbing: spread the lips to let warm water rinse over labias and pat dry  Creams that can be used on the Vulva Area V magic-amazon, [employer] Vital V Wild Yam Salve Julva- Amazon MoonMaid Botanical Pro-Meno Wild Yam Cream Coconut oil, olive oil Cleo by Damiva labial moisturizer -Amazon,  Desert Havest Releveum ( lidocaine) or Desert Harvest Gele Yes Moisturizer     

## 2021-12-01 ENCOUNTER — Ambulatory Visit: Payer: BC Managed Care – PPO | Admitting: Physical Therapy

## 2021-12-04 ENCOUNTER — Encounter: Payer: Self-pay | Admitting: Obstetrics and Gynecology

## 2021-12-08 ENCOUNTER — Ambulatory Visit: Payer: BC Managed Care – PPO | Attending: Obstetrics and Gynecology | Admitting: Physical Therapy

## 2021-12-08 DIAGNOSIS — R293 Abnormal posture: Secondary | ICD-10-CM | POA: Insufficient documentation

## 2021-12-08 DIAGNOSIS — M6281 Muscle weakness (generalized): Secondary | ICD-10-CM | POA: Insufficient documentation

## 2021-12-08 DIAGNOSIS — R279 Unspecified lack of coordination: Secondary | ICD-10-CM | POA: Diagnosis present

## 2021-12-08 NOTE — Therapy (Signed)
OUTPATIENT PHYSICAL THERAPY TREATMENT NOTE   Patient Name: Catherine Wu MRN: [id number] DOB:11-11-1990, 31 y.o., female Today's Date: 12/08/2021  PCP: Patient, No Pcp Per REFERRING PROVIDER: Radene Gunning, MD   Past Medical History:  Diagnosis Date   History of cholestasis during pregnancy    PONV (postoperative nausea and vomiting)    Past Surgical History:  Procedure Laterality Date   CESAREAN SECTION     CESAREAN SECTION N/A 05/28/2019   Procedure: CESAREAN SECTION;  Surgeon: Truett Mainland, DO;  Location: [facility];  Service: Obstetrics;  Laterality: N/A;   CESAREAN SECTION N/A 09/22/2021   Procedure: CESAREAN SECTION;  Surgeon: Donnamae Jude, MD;  Location: [facility];  Service: Obstetrics;  Laterality: N/A;   Patient Active Problem List   Diagnosis Date Noted   Gestational diabetes 06/24/2021    REFERRING DIAG: O26.899,R10.2 (ICD-10-CM) - Pelvic pain in pregnancy  THERAPY DIAG:  Muscle weakness (generalized)  Abnormal posture  Unspecified lack of coordination  PERTINENT HISTORY: [id number], HIV antibody, high-risk pregnancy,  Hx of cesarean section complicating pregnancy; History of gestational hypertension; Supervision of other normal pregnancy, antepartum; and History of cholestasis during pregnancy on their problem list. X4 c-sections Sexual abuse: No  PRECAUTIONS: Postpartum   SUBJECTIVE: pt reports continued no pain, has been doing HEP.   PAIN:  Are you having pain?No      WEIGHT BEARING RESTRICTIONS No   FALLS:  Has patient fallen in last 6 months? No   LIVING ENVIRONMENT: Lives with: lives with their family Lives in: House/apartment     OCCUPATION: Geophysicist/field seismologist part time   PLOF: Independent   PATIENT GOALS  less urine leakage with laughing a lot, mild constipation, strength post partum  BOWEL MOVEMENT Pain with bowel movement: No Type of bowel movement:Type (Bristol Stool Scale) 4, Frequency 1-2x per day, and Strain Yes Fully empty  rectum: No Leakage: No Pads: No Fiber supplement: No   URINATION Pain with urination: No Fully empty bladder: Yes:   Stream: Strong Urgency: No Frequency: worsening with pregnancy but prior to pregnancy had no concerns with this  Leakage: Coughing, Sneezing, Laughing, Lifting, Bending forward, and Intercourse Pads: No   INTERCOURSE Pain with intercourse:  Yes with penetration, mild deep penetration, has had some dryness per pt  Ability to have vaginal penetration:  Yes:   Climax: No pain Marinoff Scale: 0/3   PREGNANCY Vaginal deliveries 0 Tearing No C-section deliveries 4 Currently pregnant No    PROLAPSE None     OBJECTIVE:    DIAGNOSTIC FINDINGS:      COGNITION:            Overall cognitive status: Within functional limits for tasks assessed                          SENSATION:            Light touch: Appears intact            Proprioception: Appears intact   MUSCLE LENGTH: Bil hamstrings and adductors limited by 25%    POSTURE:  Rounded shoulders, anterior pelvic tilt   LUMBARAROM/PROM   WFL however does report and demonstrate stiffness in bil rotation and side bending   LE ROM: Bil WFL   LE MMT:   Bil hip abduction 3+/5; all other 4/5   PELVIC MMT: YES     MMT   07/07/2021  Vaginal  4/5; 6s isometric; 5 reps  Internal Anal  Sphincter    External Anal Sphincter    Puborectalis    Diastasis Recti 2 finger separation for 1.5 inches above umbilicus at first knuckle depth though does have muscle activation. 1 finger width separation below umbilicus   (Blank rows = not tested)         PALPATION:   General mild TTP at and around c-section scar with decreased mobility noted                 External Perineal Exam no TTP                             Internal Pelvic Floor bil obturator internus, pubococcygeus TTP   TONE: Slightly increased    PROLAPSE: Not seen in hooklying with cough    SPINAL MOBILITY:  All directions WFL though pt  reports some tightness at upper back with breastfeeding postures and stiffness in general.   HIP STRENGTH:  Grossly hips 4+/5, knees and ankles 5/5  TODAY'S TREATMENT   12/08/2021: Bridges 2x10 X15 opp hand/knee ball press Hip abduction with ball press x10 each Bird dogs x10 Kneel squats 10# 2x10 Seated red loop hip flexion and opp shoulder flexion x20 Red loop lateral steps 2x20  Standing marching red loop x10 each  Palloffs blue band x10 each Rotational palloffs blue band x10 each Lunge opp row blue band x10 each     PATIENT EDUCATION:  Education details: [facility] Education method: Explanation, Demonstration, Tactile cues, Verbal cues, and Handouts Education comprehension: verbalized understanding and returned demonstration     HOME EXERCISE PROGRAM: [facility]   ASSESSMENT:   CLINICAL IMPRESSION: Patient reports she has been doing HEP, starting to feel core a little more strongly and pleased with this. Pt session focused on hip and core strength with emphasis on TA activation. Pt tolerated well with moderate verbal cues for technique and TA activation. Pt would benefit from additional PT to further address deficits.       OBJECTIVE IMPAIRMENTS decreased coordination, decreased endurance, decreased mobility, decreased strength, increased fascial restrictions, increased muscle spasms, impaired flexibility, improper body mechanics, postural dysfunction, and pain.    ACTIVITY LIMITATIONS community activity.    PERSONAL FACTORS Fitness and 1 comorbidity: x3 c-sections  are also affecting patient's functional outcome.      REHAB POTENTIAL: Good   CLINICAL DECISION MAKING: Stable/uncomplicated   EVALUATION COMPLEXITY: Low     GOALS: Goals reviewed with patient? Yes   SHORT TERM GOALS: Target date: 12/20/21   Pt to be I with HEP. Baseline: Goal status: MET   2.  Pt to report no more than 1/10 pain during intercourse for improved tolerance to vaginal penetration.   Baseline:  Goal status: on going   3.  Pt to demonstrate good lifting techniques of at least 15# without compensatory strategies for child care without pelvic pain or leakage.  Baseline:  Goal status: on going       LONG TERM GOALS: Target date: 02/22/22   Pt to be I with advanced HEP. Baseline:  Goal status: NEW   2.  Pt to demonstrate at least 5/5 bil hip strength for improved pelvic stability and functional squats without pain or compensatory strategies.   Baseline:  Goal status: NEW   3.  Pt to demonstrate improved core and breathing coordination consistently with compensatory strategies while lifting 25# for improved pelvic stability and core strength for child care.    Baseline:  Goal  status: NEW     PLAN: PT FREQUENCY: 1x/week   PT DURATION: 8 sessions   PLANNED INTERVENTIONS: Therapeutic exercises, Therapeutic activity, Neuromuscular re-education, Patient/Family education, Joint mobilization, Spinal mobilization, Cryotherapy, Moist heat, Manual lymph drainage, scar mobilization, and Manual therapy   PLAN FOR NEXT SESSION: core and hip strengthening, breathing mechanics, feminine moisturizers if needed     Stacy Gardner, PT, DPT 12/09/2310:19 PM

## 2021-12-21 ENCOUNTER — Ambulatory Visit: Payer: BC Managed Care – PPO | Admitting: Physical Therapy

## 2021-12-27 ENCOUNTER — Ambulatory Visit: Payer: BC Managed Care – PPO | Attending: Obstetrics and Gynecology | Admitting: Physical Therapy

## 2021-12-27 DIAGNOSIS — M6281 Muscle weakness (generalized): Secondary | ICD-10-CM | POA: Diagnosis present

## 2021-12-27 DIAGNOSIS — R279 Unspecified lack of coordination: Secondary | ICD-10-CM | POA: Diagnosis present

## 2021-12-27 DIAGNOSIS — R293 Abnormal posture: Secondary | ICD-10-CM | POA: Insufficient documentation

## 2021-12-27 NOTE — Therapy (Signed)
OUTPATIENT PHYSICAL THERAPY TREATMENT NOTE   Patient Name: Catherine Wu MRN: [id number] DOB:06/15/1990, 31 y.o., female Today's Date: 12/27/2021  PCP: Patient, No Pcp Per REFERRING PROVIDER: Duncan, Paula, MD   Past Medical History:  Diagnosis Date   History of cholestasis during pregnancy    PONV (postoperative nausea and vomiting)    Past Surgical History:  Procedure Laterality Date   CESAREAN SECTION     CESAREAN SECTION N/A 05/28/2019   Procedure: CESAREAN SECTION;  Surgeon: Stinson, Jacob J, DO;  Location: [facility];  Service: Obstetrics;  Laterality: N/A;   CESAREAN SECTION N/A 09/22/2021   Procedure: CESAREAN SECTION;  Surgeon: Pratt, Tanya S, MD;  Location: [facility];  Service: Obstetrics;  Laterality: N/A;   Patient Active Problem List   Diagnosis Date Noted   Gestational diabetes 06/24/2021    REFERRING DIAG: O26.899,R10.2 (ICD-10-CM) - Pelvic pain in pregnancy  THERAPY DIAG:  Muscle weakness (generalized)  Abnormal posture  Unspecified lack of coordination  PERTINENT HISTORY: [id number], HIV antibody, high-risk pregnancy,  Hx of cesarean section complicating pregnancy; History of gestational hypertension; Supervision of other normal pregnancy, antepartum; and History of cholestasis during pregnancy on their problem list. X4 c-sections Sexual abuse: No  PRECAUTIONS: Postpartum   SUBJECTIVE: Pt reports no pain, does do HEP but sometimes limited. Did note she sneezed and had had urinary leakage small-moderate amount but this is very infrequent.  Has returned to intercourse and is a little painful but improving with lubrication.   PAIN:  Are you having pain?No      WEIGHT BEARING RESTRICTIONS No   FALLS:  Has patient fallen in last 6 months? No   LIVING ENVIRONMENT: Lives with: lives with their family Lives in: House/apartment     OCCUPATION: photographer part time   PLOF: Independent   PATIENT GOALS  less urine leakage with laughing a lot, mild  constipation, strength post partum  BOWEL MOVEMENT Pain with bowel movement: No Type of bowel movement:Type (Bristol Stool Scale) 4, Frequency 1-2x per day, and Strain Yes Fully empty rectum: No Leakage: No Pads: No Fiber supplement: No   URINATION Pain with urination: No Fully empty bladder: Yes:   Stream: Strong Urgency: No Frequency: worsening with pregnancy but prior to pregnancy had no concerns with this  Leakage: Coughing, Sneezing, Laughing, Lifting, Bending forward, and Intercourse Pads: No   INTERCOURSE Pain with intercourse:  Yes with penetration, mild deep penetration, has had some dryness per pt  Ability to have vaginal penetration:  Yes:   Climax: No pain Marinoff Scale: 0/3   PREGNANCY Vaginal deliveries 0 Tearing No C-section deliveries 4 Currently pregnant No    PROLAPSE None     OBJECTIVE:    DIAGNOSTIC FINDINGS:      COGNITION:            Overall cognitive status: Within functional limits for tasks assessed                          SENSATION:            Light touch: Appears intact            Proprioception: Appears intact   MUSCLE LENGTH: Bil hamstrings and adductors limited by 25%    POSTURE:  Rounded shoulders, anterior pelvic tilt   LUMBARAROM/PROM   WFL however does report and demonstrate stiffness in bil rotation and side bending   LE ROM: Bil WFL   LE MMT:     Bil hip abduction 3+/5; all other 4/5   PELVIC MMT: YES     MMT   07/07/2021  Vaginal  4/5; 6s isometric; 5 reps  Internal Anal Sphincter    External Anal Sphincter    Puborectalis    Diastasis Recti 2 finger separation for 1.5 inches above umbilicus at first knuckle depth though does have muscle activation. 1 finger width separation below umbilicus   (Blank rows = not tested)         PALPATION:   General mild TTP at and around c-section scar with decreased mobility noted                 External Perineal Exam no TTP                             Internal Pelvic  Floor bil obturator internus, pubococcygeus TTP   TONE: Slightly increased    PROLAPSE: Not seen in hooklying with cough    SPINAL MOBILITY:  All directions WFL though pt reports some tightness at upper back with breastfeeding postures and stiffness in general.   HIP STRENGTH:  Grossly hips 4+/5, knees and ankles 5/5  TODAY'S TREATMENT   12/27/2021: Manual work: c-section mobility massage with hands on therapist manual as well as suction cup above scar at midline with noted restrictions here. Pt reports suction was tender but quickly relieved with light pressure and then with noted release, returned to hands on therapist manual work. Noted improved mobility of tissue in this are post manual work and pt reports much less tenderness with release work noted.  2x10 diaphragmatic breathing with light tactile cues Half kneel: 7# 2x10 each with TA activation 7# 2x10 squat with shoulder height dumbbell lift  X6 7# stir the pots each directions  12/08/2021: Bridges 2x10 X15 opp hand/knee ball press Hip abduction with ball press x10 each Bird dogs x10 Kneel squats 10# 2x10 Seated red loop hip flexion and opp shoulder flexion x20 Red loop lateral steps 2x20  Standing marching red loop x10 each  Palloffs blue band x10 each Rotational palloffs blue band x10 each Lunge opp row blue band x10 each     PATIENT EDUCATION:  Education details: [id number] Education method: Explanation, Demonstration, Tactile cues, Verbal cues, and Handouts Education comprehension: verbalized understanding and returned demonstration     HOME EXERCISE PROGRAM: [facility]   ASSESSMENT:   CLINICAL IMPRESSION: Patient continues to do HEP, has been busy with travel and child care and does do them weekly but wants to do daily as able. Pt session focused on hip and core strength with emphasis on TA activation and manual work at c-section and above scar site. Pt had tissue restrictions noted at midline above scar site  with tenderness to palpation but improved greatly with manual work. Pt tolerated well with moderate verbal cues for technique and TA activation. Pt would benefit from additional PT to further address deficits.       OBJECTIVE IMPAIRMENTS decreased coordination, decreased endurance, decreased mobility, decreased strength, increased fascial restrictions, increased muscle spasms, impaired flexibility, improper body mechanics, postural dysfunction, and pain.    ACTIVITY LIMITATIONS community activity.    PERSONAL FACTORS Fitness and 1 comorbidity: x3 c-sections  are also affecting patient's functional outcome.      REHAB POTENTIAL: Good   CLINICAL DECISION MAKING: Stable/uncomplicated   EVALUATION COMPLEXITY: Low     GOALS: Goals reviewed with patient? Yes   SHORT TERM GOALS:   Target date: 12/20/21   Pt to be I with HEP. Baseline: Goal status: MET   2.  Pt to report no more than 1/10 pain during intercourse for improved tolerance to vaginal penetration.  Baseline:  Goal status: on going   3.  Pt to demonstrate good lifting techniques of at least 15# without compensatory strategies for child care without pelvic pain or leakage.  Baseline:  Goal status: on going       LONG TERM GOALS: Target date: 02/22/22   Pt to be I with advanced HEP. Baseline:  Goal status: NEW   2.  Pt to demonstrate at least 5/5 bil hip strength for improved pelvic stability and functional squats without pain or compensatory strategies.   Baseline:  Goal status: NEW   3.  Pt to demonstrate improved core and breathing coordination consistently with compensatory strategies while lifting 25# for improved pelvic stability and core strength for child care.    Baseline:  Goal status: NEW     PLAN: PT FREQUENCY: 1x/week   PT DURATION: 8 sessions   PLANNED INTERVENTIONS: Therapeutic exercises, Therapeutic activity, Neuromuscular re-education, Patient/Family education, Joint mobilization, Spinal  mobilization, Cryotherapy, Moist heat, Manual lymph drainage, scar mobilization, and Manual therapy   PLAN FOR NEXT SESSION: core and hip strengthening, breathing mechanics, feminine moisturizers if needed     Haley Morelli, PT, DPT 12/28/2310:36 PM     

## 2022-01-03 ENCOUNTER — Ambulatory Visit: Payer: BC Managed Care – PPO | Admitting: Physical Therapy

## 2022-01-10 ENCOUNTER — Ambulatory Visit: Payer: BC Managed Care – PPO | Admitting: Physical Therapy

## 2022-01-10 DIAGNOSIS — R293 Abnormal posture: Secondary | ICD-10-CM

## 2022-01-10 DIAGNOSIS — M6281 Muscle weakness (generalized): Secondary | ICD-10-CM

## 2022-01-10 DIAGNOSIS — R279 Unspecified lack of coordination: Secondary | ICD-10-CM

## 2022-01-10 NOTE — Therapy (Signed)
OUTPATIENT PHYSICAL THERAPY TREATMENT NOTE   Patient Name: Catherine Wu MRN: [id number] DOB:1991/01/07, 31 y.o., female Today's Date: 01/10/2022  PCP: Patient, No Pcp Per REFERRING PROVIDER: Radene Gunning, MD   Past Medical History:  Diagnosis Date   History of cholestasis during pregnancy    PONV (postoperative nausea and vomiting)    Past Surgical History:  Procedure Laterality Date   CESAREAN SECTION     CESAREAN SECTION N/A 05/28/2019   Procedure: CESAREAN SECTION;  Surgeon: Truett Mainland, DO;  Location: [facility];  Service: Obstetrics;  Laterality: N/A;   CESAREAN SECTION N/A 09/22/2021   Procedure: CESAREAN SECTION;  Surgeon: Donnamae Jude, MD;  Location: [facility];  Service: Obstetrics;  Laterality: N/A;   Patient Active Problem List   Diagnosis Date Noted   Gestational diabetes 06/24/2021    REFERRING DIAG: O26.899,R10.2 (ICD-10-CM) - Pelvic pain in pregnancy  THERAPY DIAG:  Muscle weakness (generalized)  Abnormal posture  Unspecified lack of coordination  PERTINENT HISTORY: [id number], HIV antibody, high-risk pregnancy,  Hx of cesarean section complicating pregnancy; History of gestational hypertension; Supervision of other normal pregnancy, antepartum; and History of cholestasis during pregnancy on their problem list. X4 c-sections Sexual abuse: No  PRECAUTIONS: Postpartum   SUBJECTIVE: Pt reports pain with intercourse was a little better, improving each time. C-section scar massage at home has "felt good".   PAIN:  Are you having pain?No      WEIGHT BEARING RESTRICTIONS No   FALLS:  Has patient fallen in last 6 months? No   LIVING ENVIRONMENT: Lives with: lives with their family Lives in: House/apartment     OCCUPATION: Geophysicist/field seismologist part time   PLOF: Independent   PATIENT GOALS  less urine leakage with laughing a lot, mild constipation, strength post partum  BOWEL MOVEMENT Pain with bowel movement: No Type of bowel movement:Type  (Bristol Stool Scale) 4, Frequency 1-2x per day, and Strain Yes Fully empty rectum: No Leakage: No Pads: No Fiber supplement: No   URINATION Pain with urination: No Fully empty bladder: Yes:   Stream: Strong Urgency: No Frequency: worsening with pregnancy but prior to pregnancy had no concerns with this  Leakage: Coughing, Sneezing, Laughing, Lifting, Bending forward, and Intercourse Pads: No   INTERCOURSE Pain with intercourse:  Yes with penetration, mild deep penetration, has had some dryness per pt  Ability to have vaginal penetration:  Yes:   Climax: No pain Marinoff Scale: 0/3   PREGNANCY Vaginal deliveries 0 Tearing No C-section deliveries 4 Currently pregnant No    PROLAPSE None     OBJECTIVE:    DIAGNOSTIC FINDINGS:      COGNITION:            Overall cognitive status: Within functional limits for tasks assessed                          SENSATION:            Light touch: Appears intact            Proprioception: Appears intact   MUSCLE LENGTH: Bil hamstrings and adductors limited by 25%    POSTURE:  Rounded shoulders, anterior pelvic tilt   LUMBARAROM/PROM   WFL however does report and demonstrate stiffness in bil rotation and side bending   LE ROM: Bil WFL   LE MMT:   Bil hip abduction 3+/5; all other 4/5   PELVIC MMT: YES     MMT   07/07/2021  Vaginal  4/5; 6s isometric; 5 reps  Internal Anal Sphincter    External Anal Sphincter    Puborectalis    Diastasis Recti 2 finger separation for 1.5 inches above umbilicus at first knuckle depth though does have muscle activation. 1 finger width separation below umbilicus   (Blank rows = not tested)         PALPATION:   General mild TTP at and around c-section scar with decreased mobility noted                 External Perineal Exam no TTP                             Internal Pelvic Floor bil obturator internus, pubococcygeus TTP   TONE: Slightly increased    PROLAPSE: Not seen in  hooklying with cough    SPINAL MOBILITY:  All directions WFL though pt reports some tightness at upper back with breastfeeding postures and stiffness in general.   HIP STRENGTH:  Grossly hips 4+/5, knees and ankles 5/5  TODAY'S TREATMENT   01/10/2022: Bird dogs 2x10 Hip thrusts 2x10 2x10 squats 15# 2x10 9# alt bent rows 9# dumb bell TA activation with front raise (one dumb bell used) 2x10 2x10 9# flies 2x10 9# half kneel diagonals with TA activation 2x10 lunges 15# Childs pose 2x30s, side bending 2x30s each Needle threaders 2x30s Thoracic openers sidelying 2x30s  12/27/2021: Manual work: c-section mobility massage with hands on therapist manual as well as suction cup above scar at midline with noted restrictions here. Pt reports suction was tender but quickly relieved with light pressure and then with noted release, returned to hands on therapist manual work. Noted improved mobility of tissue in this are post manual work and pt reports much less tenderness with release work noted.  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 Pt session focused on hip and core strength with emphasis on TA activation. Pt tolerated well with minimal verbal cues for technique and TA activation and breathing coordination. Pt would benefit from additional PT to further address deficits.      OBJECTIVE IMPAIRMENTS decreased coordination, decreased endurance, decreased mobility,  decreased strength, increased fascial restrictions, increased muscle spasms, impaired flexibility, improper body mechanics, postural dysfunction, and pain.    ACTIVITY LIMITATIONS community activity.    PERSONAL FACTORS Fitness and 1 comorbidity: x3 c-sections  are also affecting patient's functional outcome.      REHAB POTENTIAL: Good   CLINICAL DECISION MAKING: Stable/uncomplicated   EVALUATION COMPLEXITY: Low     GOALS: Goals reviewed with patient? Yes   SHORT TERM GOALS: Target date: 12/20/21   Pt to be I with HEP. Baseline: Goal status: MET   2.  Pt to report no more than 1/10 pain during intercourse for improved tolerance to vaginal penetration.  Baseline:  Goal status: on going   3.  Pt to demonstrate good lifting techniques of at least 15# without compensatory strategies for child care without pelvic  pain or leakage.  Baseline:  Goal status: on going       LONG TERM GOALS: Target date: 02/22/22   Pt to be I with advanced HEP. Baseline:  Goal status: NEW   2.  Pt to demonstrate at least 5/5 bil hip strength for improved pelvic stability and functional squats without pain or compensatory strategies.   Baseline:  Goal status: NEW   3.  Pt to demonstrate improved core and breathing coordination consistently with compensatory strategies while lifting 25# for improved pelvic stability and core strength for child care.    Baseline:  Goal status: NEW     PLAN: PT FREQUENCY: 1x/week   PT DURATION: 8 sessions   PLANNED INTERVENTIONS: Therapeutic exercises, Therapeutic activity, Neuromuscular re-education, Patient/Family education, Joint mobilization, Spinal mobilization, Cryotherapy, Moist heat, Manual lymph drainage, scar mobilization, and Manual therapy   PLAN FOR NEXT SESSION: core and hip strengthening, breathing mechanics, feminine moisturizers if needed     Stacy Gardner, PT, DPT 01/11/2311:26 PM

## 2022-01-17 ENCOUNTER — Ambulatory Visit: Payer: BC Managed Care – PPO | Admitting: Physical Therapy

## 2022-01-24 ENCOUNTER — Ambulatory Visit: Payer: BC Managed Care – PPO | Admitting: Physical Therapy

## 2022-01-24 DIAGNOSIS — R293 Abnormal posture: Secondary | ICD-10-CM

## 2022-01-24 DIAGNOSIS — M6281 Muscle weakness (generalized): Secondary | ICD-10-CM

## 2022-01-24 DIAGNOSIS — R279 Unspecified lack of coordination: Secondary | ICD-10-CM

## 2022-01-24 NOTE — Therapy (Signed)
OUTPATIENT PHYSICAL THERAPY TREATMENT NOTE   Patient Name: Catherine Wu MRN: [id number] DOB:06-26-1990, 31 y.o., female Today's Date: 01/24/2022  PCP: Patient, No Pcp Per REFERRING PROVIDER: Radene Gunning, MD   Past Medical History:  Diagnosis Date   History of cholestasis during pregnancy    PONV (postoperative nausea and vomiting)    Past Surgical History:  Procedure Laterality Date   CESAREAN SECTION     CESAREAN SECTION N/A 05/28/2019   Procedure: CESAREAN SECTION;  Surgeon: Truett Mainland, DO;  Location: [facility];  Service: Obstetrics;  Laterality: N/A;   CESAREAN SECTION N/A 09/22/2021   Procedure: CESAREAN SECTION;  Surgeon: Donnamae Jude, MD;  Location: [facility];  Service: Obstetrics;  Laterality: N/A;   Patient Active Problem List   Diagnosis Date Noted   Gestational diabetes 06/24/2021    REFERRING DIAG: O26.899,R10.2 (ICD-10-CM) - Pelvic pain in pregnancy  THERAPY DIAG:  Muscle weakness (generalized)  Unspecified lack of coordination  Abnormal posture  PERTINENT HISTORY: [id number], HIV antibody, high-risk pregnancy,  Hx of cesarean section complicating pregnancy; History of gestational hypertension; Supervision of other normal pregnancy, antepartum; and History of cholestasis during pregnancy on their problem list. X4 c-sections Sexual abuse: No  PRECAUTIONS: Postpartum   SUBJECTIVE: pt reports she has no longer been having pain with intercourse and reports she has been able to tell a difference with strengthening, no longer having hip pain or back pain.   PAIN:  Are you having pain?No      WEIGHT BEARING RESTRICTIONS No   FALLS:  Has patient fallen in last 6 months? No   LIVING ENVIRONMENT: Lives with: lives with their family Lives in: House/apartment     OCCUPATION: Geophysicist/field seismologist part time   PLOF: Independent   PATIENT GOALS  less urine leakage with laughing a lot, mild constipation, strength post partum  BOWEL MOVEMENT Pain with  bowel movement: No Type of bowel movement:Type (Bristol Stool Scale) 4, Frequency 1-2x per day, and Strain Yes Fully empty rectum: No Leakage: No Pads: No Fiber supplement: No   URINATION Pain with urination: No Fully empty bladder: Yes:   Stream: Strong Urgency: No Frequency: worsening with pregnancy but prior to pregnancy had no concerns with this  Leakage: Coughing, Sneezing, Laughing, Lifting, Bending forward, and Intercourse Pads: No   INTERCOURSE Pain with intercourse:  Yes with penetration, mild deep penetration, has had some dryness per pt  Ability to have vaginal penetration:  Yes:   Climax: No pain Marinoff Scale: 0/3   PREGNANCY Vaginal deliveries 0 Tearing No C-section deliveries 4 Currently pregnant No    PROLAPSE None     OBJECTIVE:    DIAGNOSTIC FINDINGS:      COGNITION:            Overall cognitive status: Within functional limits for tasks assessed                          SENSATION:            Light touch: Appears intact            Proprioception: Appears intact   MUSCLE LENGTH: Bil hamstrings and adductors limited by 25%    POSTURE:  Rounded shoulders, anterior pelvic tilt   LUMBARAROM/PROM   WFL however does report and demonstrate stiffness in bil rotation and side bending   LE ROM: Bil WFL   LE MMT:   Bil hip abduction 3+/5; all other 4/5  PELVIC MMT: YES     MMT   07/07/2021  Vaginal  4/5; 6s isometric; 5 reps  Internal Anal Sphincter    External Anal Sphincter    Puborectalis    Diastasis Recti 2 finger separation for 1.5 inches above umbilicus at first knuckle depth though does have muscle activation. 1 finger width separation below umbilicus   (Blank rows = not tested)         PALPATION:   General mild TTP at and around c-section scar with decreased mobility noted                 External Perineal Exam no TTP                             Internal Pelvic Floor bil obturator internus, pubococcygeus TTP    TONE: Slightly increased    PROLAPSE: Not seen in hooklying with cough    SPINAL MOBILITY:  All directions WFL though pt reports some tightness at upper back with breastfeeding postures and stiffness in general.   HIP STRENGTH:  Grossly hips 4+/5, knees and ankles 5/5  TODAY'S TREATMENT   01/24/22:   Bird dogs x10, progressed to green band x10 Hip thrusts with 10# 2x10 20# squats 2x10 Bosu lunges 2x10 each (lead leg on bosu) Modified elevated plank 3x30s Rotational palloffs 2x10 blue band  Cross body blue band diagonals 2x10 each      PATIENT EDUCATION:  Education details: [facility] Education method: Explanation, Demonstration, Tactile cues, Verbal cues, and Handouts Education comprehension: verbalized understanding and returned demonstration     HOME EXERCISE PROGRAM: [facility]   ASSESSMENT:   CLINICAL IMPRESSION: Patient continues to do HEP, feels like she is feeling stronger and understands how to do exercises better at home. Session focused on hip and core strengthening with increased challenge of bands and weight today. Pt tolerated well. Pt would benefit from additional PT to further address deficits.      OBJECTIVE IMPAIRMENTS decreased coordination, decreased endurance, decreased mobility, decreased strength, increased fascial restrictions, increased muscle spasms, impaired flexibility, improper body mechanics, postural dysfunction, and pain.    ACTIVITY LIMITATIONS community activity.    PERSONAL FACTORS Fitness and 1 comorbidity: x3 c-sections  are also affecting patient's functional outcome.      REHAB POTENTIAL: Good   CLINICAL DECISION MAKING: Stable/uncomplicated   EVALUATION COMPLEXITY: Low     GOALS: Goals reviewed with patient? Yes   SHORT TERM GOALS: Target date: 12/20/21   Pt to be I with HEP. Baseline: Goal status: MET   2.  Pt to report no more than 1/10 pain during intercourse for improved tolerance to vaginal penetration.   Baseline:  Goal status: MET   3.  Pt to demonstrate good lifting techniques of at least 15# without compensatory strategies for child care without pelvic pain or leakage.  Baseline:  Goal status: MET       LONG al status: NEW     PLAN: PT FREQUENCY: 1x/week   PT DURATION: 8 sessions   PLANNED INTERVENTIONS: Therapeutic exercises, Therapeutic activity, Neuromuscular re-education, Patient/Family education, Joint mobilization,TERM GOALS: Target date: 02/22/22   Pt to be I with advanced HEP. Baseline:  Goal status: MET   2.  Pt to demonstrate at least 5/5 bil hip strength for improved pelvic stability and functional squats without pain or compensatory strategies.   Baseline:  Goal status: NEW   3.  Pt to demonstrate improved core and  breathing coordination consistently with compensatory strategies while lifting 25# for improved pelvic stability and core strength for child care.    Baseline:  Go Spinal mobilization, Cryotherapy, Moist heat, Manual lymph drainage, scar mobilization, and Manual therapy   PLAN FOR NEXT SESSION: core and hip strengthening, breathing mechanics, feminine moisturizers if needed     Stacy Gardner, PT, DPT 01/25/2311:29 PM

## 2022-01-31 ENCOUNTER — Ambulatory Visit: Payer: BC Managed Care – PPO | Attending: Obstetrics and Gynecology | Admitting: Physical Therapy

## 2022-01-31 DIAGNOSIS — R279 Unspecified lack of coordination: Secondary | ICD-10-CM | POA: Diagnosis present

## 2022-01-31 DIAGNOSIS — M6281 Muscle weakness (generalized): Secondary | ICD-10-CM | POA: Diagnosis not present

## 2022-01-31 NOTE — Therapy (Signed)
OUTPATIENT PHYSICAL THERAPY TREATMENT NOTE   Patient Name: Catherine Wu MRN: [id number] DOB:07-26-1990, 31 y.o., female Today's Date: 01/31/2022  PCP: Patient, No Pcp Per REFERRING PROVIDER: Radene Gunning, MD END OF SESSION: START: [id number] TLX:[id number]  Past Medical History:  Diagnosis Date   History of cholestasis during pregnancy    PONV (postoperative nausea and vomiting)    Past Surgical History:  Procedure Laterality Date   CESAREAN SECTION     CESAREAN SECTION N/A 05/28/2019   Procedure: CESAREAN SECTION;  Surgeon: Truett Mainland, DO;  Location: [facility];  Service: Obstetrics;  Laterality: N/A;   CESAREAN SECTION N/A 09/22/2021   Procedure: CESAREAN SECTION;  Surgeon: Donnamae Jude, MD;  Location: [facility];  Service: Obstetrics;  Laterality: N/A;   Patient Active Problem List   Diagnosis Date Noted   Gestational diabetes 06/24/2021    REFERRING DIAG: O26.899,R10.2 (ICD-10-CM) - Pelvic pain in pregnancy  THERAPY DIAG:  Muscle weakness (generalized)  Unspecified lack of coordination  PERTINENT HISTORY: [id number], HIV antibody, high-risk pregnancy,  Hx of cesarean section complicating pregnancy; History of gestational hypertension; Supervision of other normal pregnancy, antepartum; and History of cholestasis during pregnancy on their problem list. X4 c-sections Sexual abuse: No  PRECAUTIONS: Postpartum   SUBJECTIVE: pt reports she has no longer been having pain with intercourse and reports she has been able to tell a difference with strengthening, no longer having hip pain or back pain.   PAIN:  Are you having pain?No      WEIGHT BEARING RESTRICTIONS No   FALLS:  Has patient fallen in last 6 months? No   LIVING ENVIRONMENT: Lives with: lives with their family Lives in: House/apartment     OCCUPATION: Geophysicist/field seismologist part time   PLOF: Independent   PATIENT GOALS  less urine leakage with laughing a lot, mild constipation, strength post partum  BOWEL  MOVEMENT Pain with bowel movement: No Type of bowel movement:Type (Bristol Stool Scale) 4, Frequency 1-2x per day, and Strain Yes Fully empty rectum: No Leakage: No Pads: No Fiber supplement: No   URINATION Pain with urination: No Fully empty bladder: Yes:   Stream: Strong Urgency: No Frequency: worsening with pregnancy but prior to pregnancy had no concerns with this  Leakage: Coughing, Sneezing, Laughing, Lifting, Bending forward, and Intercourse Pads: No   INTERCOURSE Pain with intercourse:  Yes with penetration, mild deep penetration, has had some dryness per pt  Ability to have vaginal penetration:  Yes:   Climax: No pain Marinoff Scale: 0/3   PREGNANCY Vaginal deliveries 0 Tearing No C-section deliveries 4 Currently pregnant No    PROLAPSE None     OBJECTIVE:    DIAGNOSTIC FINDINGS:      COGNITION:            Overall cognitive status: Within functional limits for tasks assessed                          SENSATION:            Light touch: Appears intact            Proprioception: Appears intact   MUSCLE LENGTH: Bil hamstrings and adductors limited by 25%    POSTURE:  Rounded shoulders, anterior pelvic tilt   LUMBARAROM/PROM   WFL however does report and demonstrate stiffness in bil rotation and side bending   LE ROM: Bil WFL   LE MMT:   Bil hip abduction 3+/5; all other 4/5  PELVIC MMT:     MMT   07/07/2021 01/31/22   Vaginal  4/5; 6s isometric; 5 reps   Internal Anal Sphincter     External Anal Sphincter     Puborectalis     Diastasis Recti 2 finger separation for 1.5 inches above umbilicus at first knuckle depth though does have muscle activation. 1 finger width separation below umbilicus  No separation above umbilicus and 0.5 finger width separation below umbilicus for <1 inch. With good muscle tone noted.   (Blank rows = not tested)         PALPATION:   General mild TTP at and around c-section scar with decreased mobility noted                  External Perineal Exam no TTP                             Internal Pelvic Floor bil obturator internus, pubococcygeus TTP   TONE: Slightly increased    PROLAPSE: Not seen in hooklying with cough    SPINAL MOBILITY:  All directions WFL though pt reports some tightness at upper back with breastfeeding postures and stiffness in general.   HIP STRENGTH:  Grossly hips 4+/5, knees and ankles 5/5\  01/31/22 bil LE 5/5  TODAY'S TREATMENT   01/31/22: Beginner crunches with head lifts and TA activation x20 Progressed to x10 shoulders lifting with hands to touch knees Opp hand/knee press x10> beginner bicycle crunches  x10 each Bird dogs trunk rotation blue band x10  Squats 2x10 25#  Hip thrust 3x10 Hip machine: 55# flexion, abduction x10 each        PATIENT EDUCATION:  Education details: [facility] Education method: Explanation, Demonstration, Tactile cues, Verbal cues, and Handouts Education comprehension: verbalized understanding and returned demonstration     HOME EXERCISE PROGRAM: [facility]   ASSESSMENT:   CLINICAL IMPRESSION: Patient reports she feels comfortable with self progressions of workouts, understands coordination of breathing and core and pelvic floor mobility with exercises and household activities. Pt denies any symptoms, no longer having any pain, c-section scar moving well without pain and no leakage at least regularly. Pt reports she may have leakage with strong laugh but hasn't as she remembers. Pt has met all goals and comfortable with DC today.     OBJECTIVE IMPAIRMENTS decreased coordination, decreased endurance, decreased mobility, decreased strength, increased fascial restrictions, increased muscle spasms, impaired flexibility, improper body mechanics, postural dysfunction, and pain.    ACTIVITY LIMITATIONS community activity.    PERSONAL FACTORS Fitness and 1 comorbidity: x3 c-sections  are also affecting patient's functional outcome.       REHAB POTENTIAL: Good   CLINICAL DECISION MAKING: Stable/uncomplicated   EVALUATION COMPLEXITY: Low     GOALS: Goals reviewed with patient? Yes   SHORT TERM GOALS: Target date: 12/20/21   Pt to be I with HEP. Baseline: Goal status: MET   2.  Pt to report no more than 1/10 pain during intercourse for improved tolerance to vaginal penetration.  Baseline:  Goal status: MET   3.  Pt to demonstrate good lifting techniques of at least 15# without compensatory strategies for child care without pelvic pain or leakage.  Baseline:  Goal status: MET        LONG TERM GOALS: Target date: 02/22/22   Pt to be I with advanced HEP. Baseline:  Goal status: MET   2.  Pt to demonstrate at least 5/5  bil hip strength for improved pelvic stability and functional squats without pain or compensatory strategies.   Baseline:  Goal status: MET   3.  Pt to demonstrate improved core and breathing coordination consistently with compensatory strategies while lifting 25# for improved pelvic stability and core strength for child care.    Baseline:   Goal status: MET   PLAN: PT FREQUENCY: 1x/week   PT DURATION: 8 sessions   PLANNED INTERVENTIONS: Therapeutic exercises, Therapeutic activity, Neuromuscular re-education, Patient/Family education, Joint mobilization,Go Spinal mobilization, Cryotherapy, Moist heat, Manual lymph drainage, scar mobilization, and Manual therapy PLAN FOR NEXT SESSION: core and hip strengthening, breathing mechanics, feminine moisturizers if needed  PHYSICAL THERAPY DISCHARGE SUMMARY  Visits from Start of Care: 6  Current functional level related to goals / functional outcomes: ALL GOALS MET   Remaining deficits: NONE PER PT   Education / Equipment: HEP   Patient agrees to discharge. Patient goals were met. Patient is being discharged due to being pleased with the current functional level.  Stacy Gardner, PT, DPT 02/01/2311:33 PM

## 2022-02-14 ENCOUNTER — Encounter: Payer: BC Managed Care – PPO | Admitting: Physical Therapy

## 2022-12-27 ENCOUNTER — Ambulatory Visit: Payer: BC Managed Care – PPO | Admitting: Obstetrics and Gynecology

## 2022-12-27 ENCOUNTER — Encounter: Payer: Self-pay | Admitting: Obstetrics and Gynecology

## 2022-12-27 ENCOUNTER — Other Ambulatory Visit (HOSPITAL_COMMUNITY)
Admission: RE | Admit: 2022-12-27 | Discharge: 2022-12-27 | Disposition: A | Discharge status: Home / Self Care | Payer: BC Managed Care – PPO | Source: Ambulatory Visit | Attending: Obstetrics and Gynecology | Admitting: Obstetrics and Gynecology

## 2022-12-27 VITALS — BP 118/74 | HR 81 | Ht 62.0 in | Wt 173.0 lb

## 2022-12-27 DIAGNOSIS — Z01419 Encounter for gynecological examination (general) (routine) without abnormal findings: Secondary | ICD-10-CM | POA: Insufficient documentation

## 2022-12-27 DIAGNOSIS — Z124 Encounter for screening for malignant neoplasm of cervix: Secondary | ICD-10-CM | POA: Diagnosis not present

## 2022-12-27 NOTE — Progress Notes (Signed)
   ANNUAL EXAM Patient name: Catherine Wu MRN [id number]  Date of birth: [birth year] Chief Complaint:   Annual Exam  History of Present Illness:   Catherine Wu is a 32 y.o. [phone]  female being seen today for a routine annual exam.  Current complaints: Reports experiencing a couple labial cysts, that drain on their own. Denies vaginal symptoms. Performs self breast exam.  Patient's last menstrual period was 12/21/2022.   The pregnancy intention screening data noted above was reviewed. Potential methods of contraception were discussed. The patient elected to proceed with No data recorded.   Last pap 2021. Results were: NILM w/ HRHPV not done. H/O abnormal pap: no Last mammogram: n/a. Results were: N/A. Family h/o breast cancer: no      06/30/2021   11:30 AM  Depression screen PHQ 2/9  Decreased Interest 0  Down, Depressed, Hopeless 0  PHQ - 2 Score 0         No data to display           Review of Systems:   Pertinent items are noted in HPI Denies any headaches, blurred vision, fatigue, shortness of breath, chest pain, abdominal pain, abnormal vaginal discharge/itching/odor/irritation, problems with periods, bowel movements, urination, or intercourse unless otherwise stated above. Pertinent History Reviewed:  Reviewed past medical,surgical, social and family history.  Reviewed problem list, medications and allergies. Physical Assessment:   Vitals:   12/27/22 1551  BP: 118/74  Pulse: 81  Weight: 173 lb (78.5 kg)  Height: 5\' 2"  (1.575 m)  Body mass index is 31.64 kg/m.        Physical Examination:   General appearance - well appearing, and in no distress  Mental status - alert, oriented to person, place, and time  Psych:  She has a normal mood and affect  Skin - warm and dry, normal color, no suspicious lesions noted  Chest - effort normal, all lung fields clear to auscultation bilaterally  Heart - normal rate and regular rhythm  Neck:  midline trachea, no  thyromegaly or nodules  Breasts - breasts appear normal, no suspicious masses, no skin or nipple changes or  axillary nodes  Abdomen - soft, nontender, nondistended, no masses or organomegaly  Pelvic - VULVA: normal appearing vulva with no masses, tenderness or lesions  VAGINA: normal appearing vagina with normal color and discharge, no lesions  CERVIX: normal appearing cervix without discharge or lesions, no CMT  Thin prep pap is done cotesting  UTERUS: uterus is felt to be normal size, shape, consistency and nontender   ADNEXA: No adnexal masses or tenderness noted.  Extremities:  No swelling or varicosities noted  Chaperone present for exam  No results found for this or any previous visit (from the past 24 hour(s)).  Assessment & Plan:   1. Well woman exam with routine gynecological exam Pap collected today Continue self  breast exams Discussed supportive measures for cysts, follow up if not resolving.  2. Cervical cancer screening - Cytology - PAP( [place])  Mammogram: @ 32yo, or sooner if problems   No orders of the defined types were placed in this encounter.   Follow-up: Return in about 1 year (around 12/27/2023) for Thayer Jew, FNP

## 2023-01-04 LAB — CYTOLOGY - PAP
Comment: NEGATIVE
Diagnosis: NEGATIVE
High risk HPV: NEGATIVE

## 2023-06-26 IMAGING — US US MFM FETAL BPP W/O NON-STRESS
1 series · 14 of 28 positions shown · non-contrast
Comparison: none

[Series 1: us mfm fetal bpp w/o non-stress · 30 acquisitions, 14 frames shown]
[im 2/30]
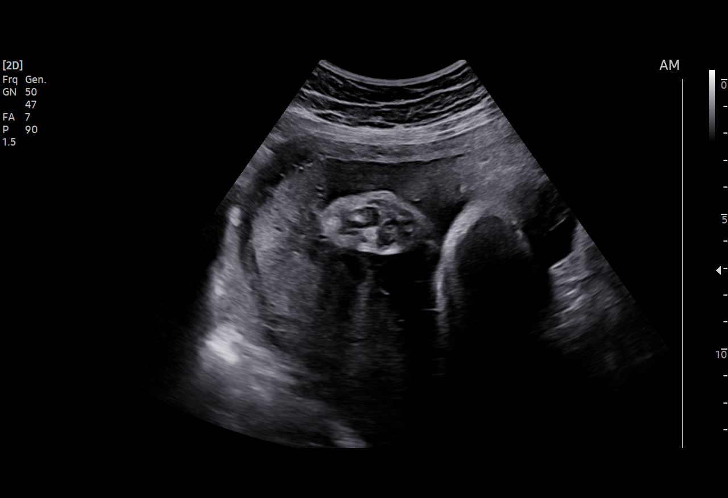
[im 4/30]
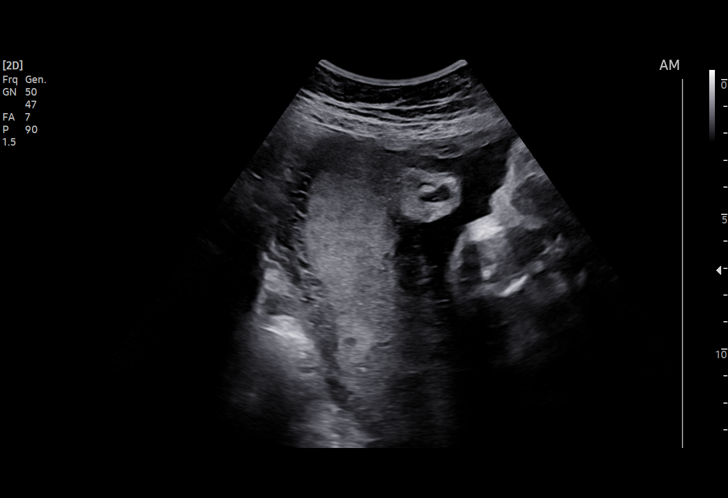
[im 6/30]
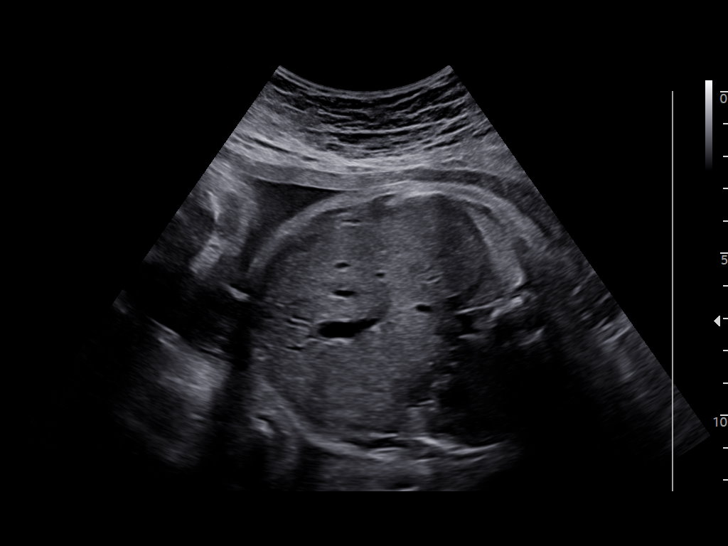
[im 8/30]
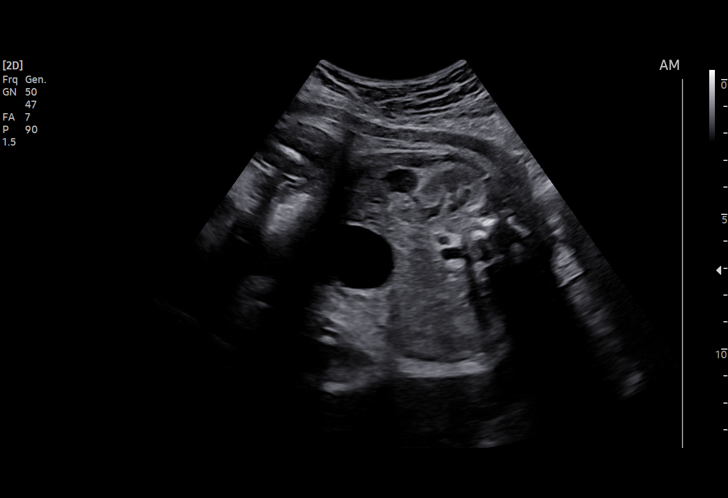
[im 10/30]
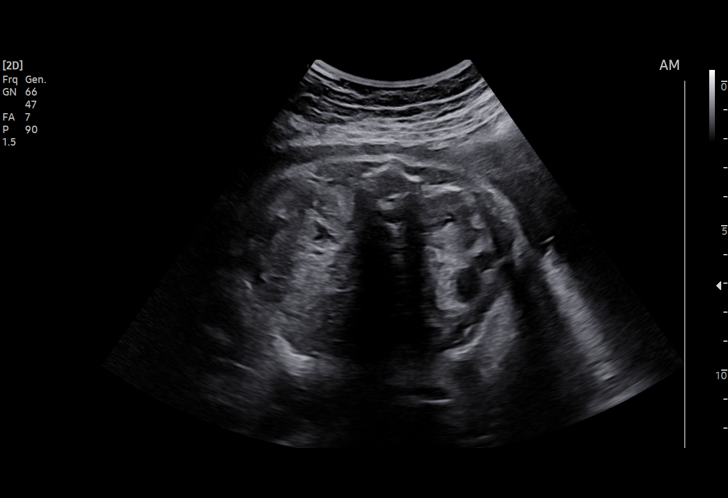
[im 12/30]
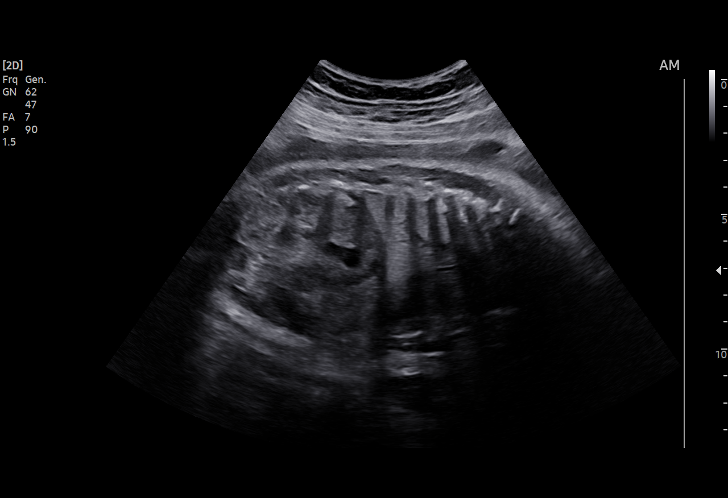
[im 14/30]
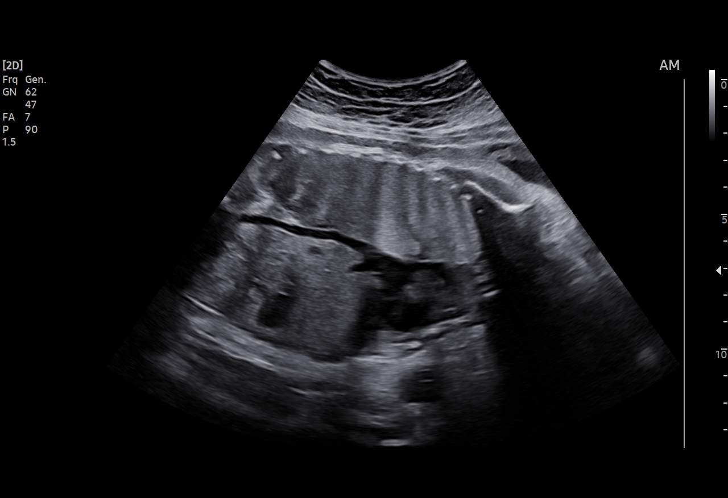
[im 17/30]
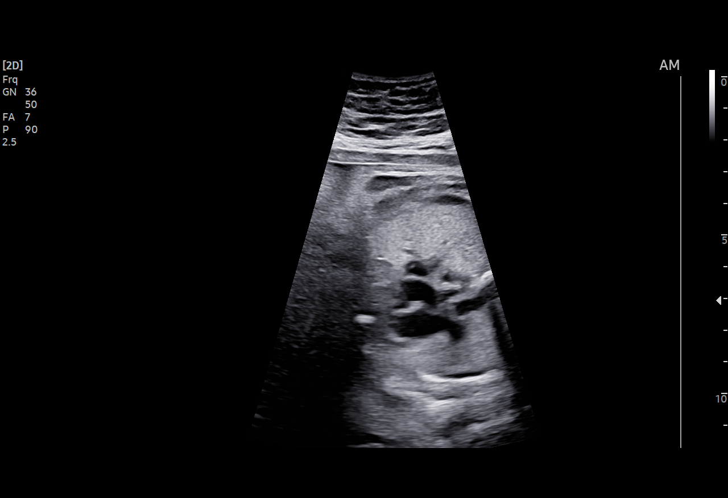
[im 19/30]
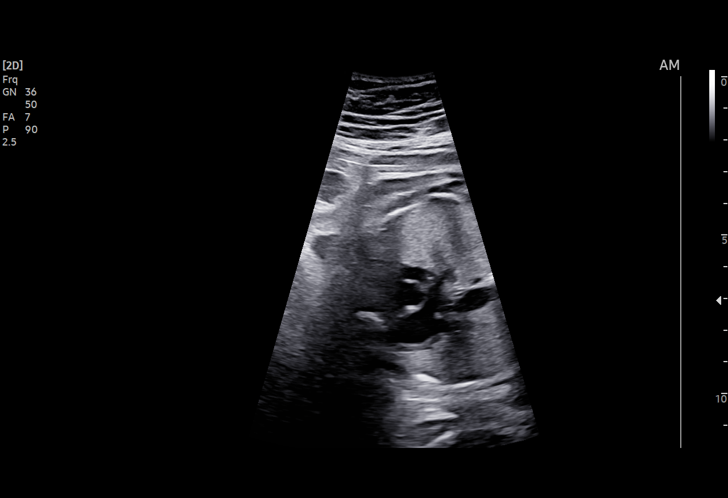
[im 21/30]
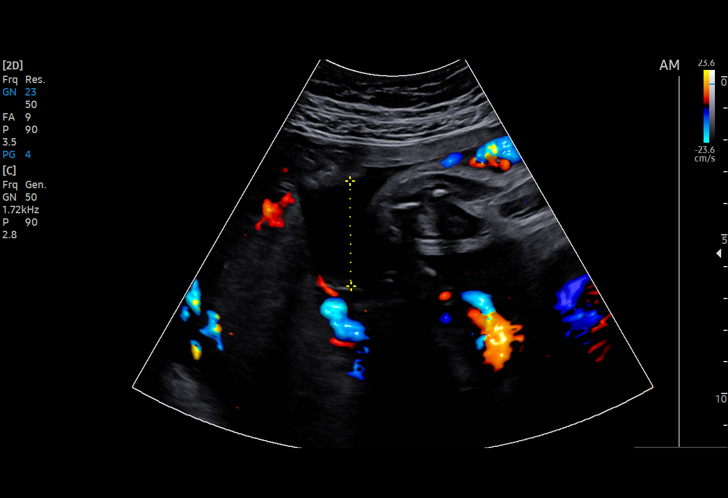
[im 23/30]
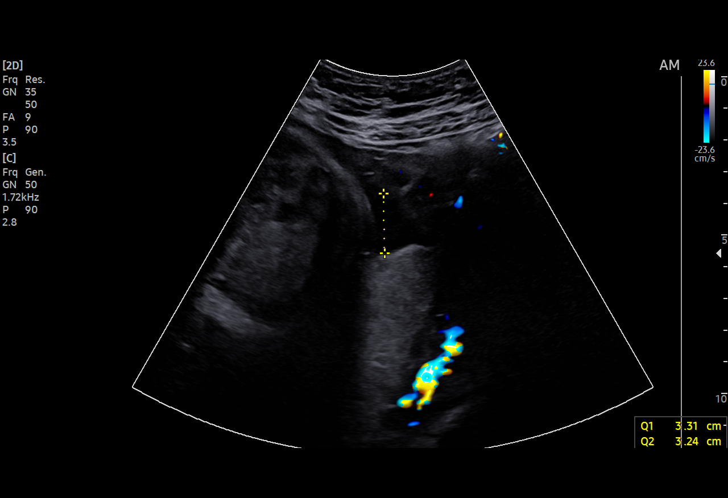
[im 25/30]
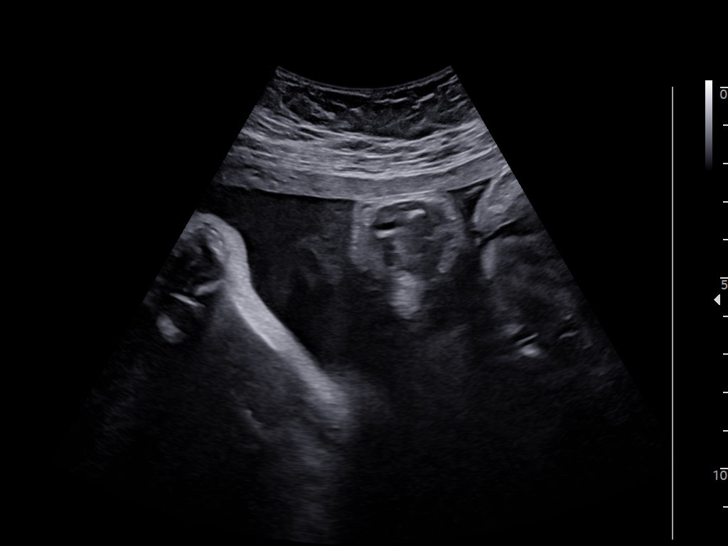
[im 27/30]
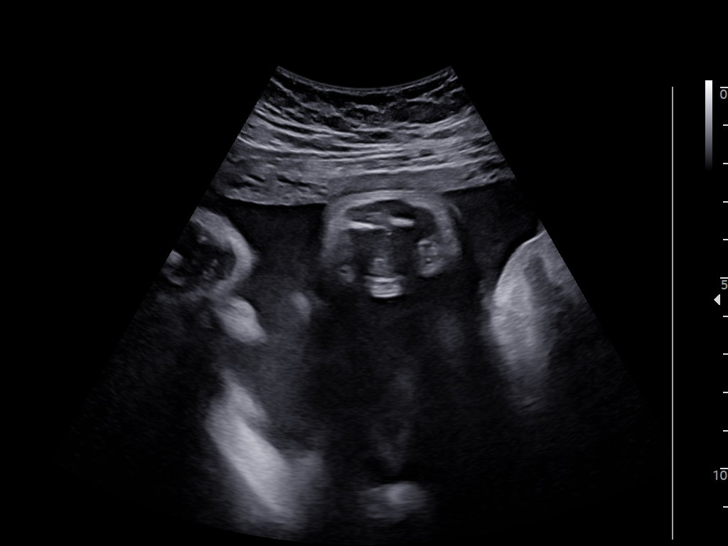
[im 30/30]
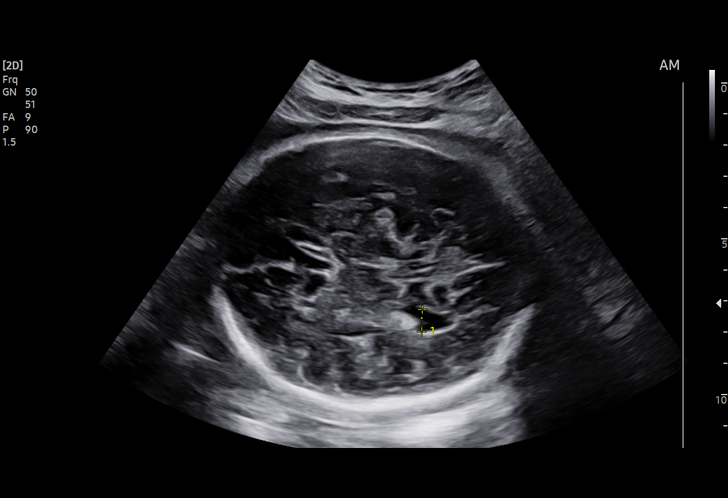

[14 of 28 positions shown; findings below may reference images not displayed]

Indications

 34 weeks gestation of pregnancy
 Gestational diabetes in pregnancy,
 controlled by oral hypoglycemic drugs
 (metformin)
 Fetal choroid plexus cyst (resolved)
 History of cesarean delivery x 3
 Poor obstetric history: Previous gestational
 HTN/cholestasis
 Declined genetic screening
Vital Signs

 BP:          121/58
Fetal Evaluation

 Num Of Fetuses:         1
 Fetal Heart Rate(bpm):  137
 Cardiac Activity:       Observed
 Presentation:           Cephalic
 Placenta:               Posterior
 P. Cord Insertion:      Previously Visualized

 Amniotic Fluid
 AFI FV:      Within normal limits

 AFI Sum(cm)     %Tile       Largest Pocket(cm)
 8.43            8

 RUQ(cm)       RLQ(cm)       LUQ(cm)        LLQ(cm)
 3.31          3.24          1.88           0
Biophysical Evaluation

 Amniotic F.V:   Within normal limits       F. Tone:        Observed
 F. Movement:    Observed                   Score:          [DATE]
 F. Breathing:   Observed
OB History

 Gravidity:    6         Term:   3        Prem:   0        SAB:   2
 TOP:          0       Ectopic:  0        Living: 3
Gestational Age

 LMP:           34w 2d        Date:  12/21/20                  EDD:   09/27/21
 Best:          34w 2d     Det. By:  LMP  (12/21/20)          EDD:   09/27/21
Anatomy

 Heart:                 Appears normal         Abdomen:                Appears normal
                        (4CH, axis, and
                        situs)
 Diaphragm:             Appears normal         Kidneys:                Appear normal
 Stomach:               Appears normal, left   Bladder:                Appears normal
                        sided
Impression

 Gestational diabetes.  Patient takes metformin.  She reports
 her fasting levels are still high (100 110).  Postprandial levels
 are reportedly within normal range.

 Amniotic fluid is normal and good fetal activity seen.
 Antenatal testing is reassuring.  Cephalic presentation.
Recommendations

 -Fetal growth and BPP on 08/31/21 (NST at prenatal visit
 next week).
                Azima, [place]

## 2023-06-27 ENCOUNTER — Encounter: Payer: Self-pay | Admitting: Advanced Practice Midwife

## 2023-07-09 IMAGING — US US MFM OB FOLLOW-UP
1 series · 13 of 28 positions shown · non-contrast
Comparison: none

[Series 1: us mfm ob follow-up · 48 acquisitions, 13 frames shown]
[im 2/48]
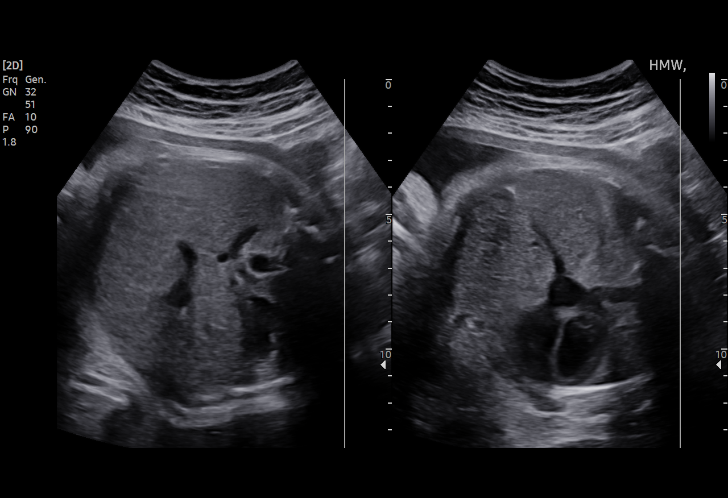
[im 6/48]
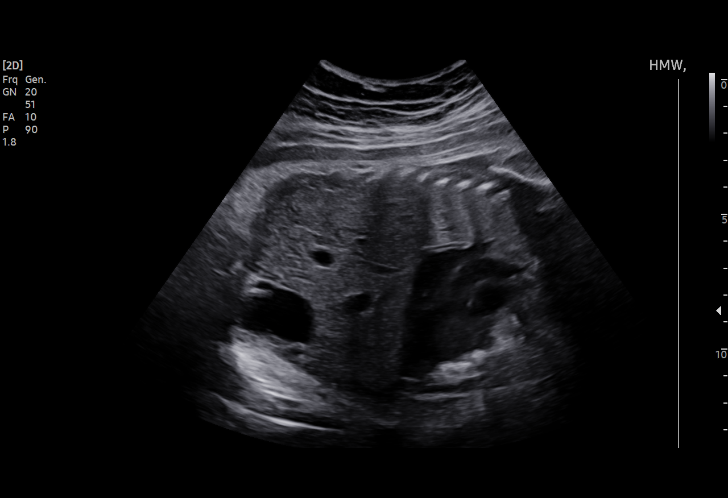
[im 9/48]
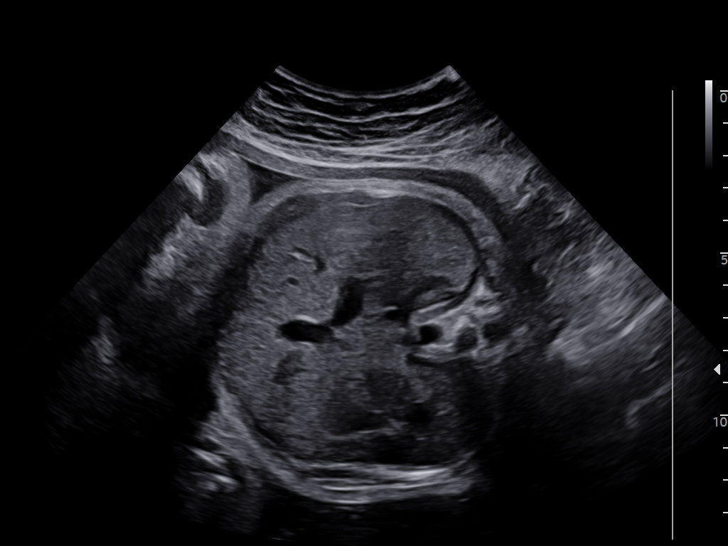
[im 13/48]
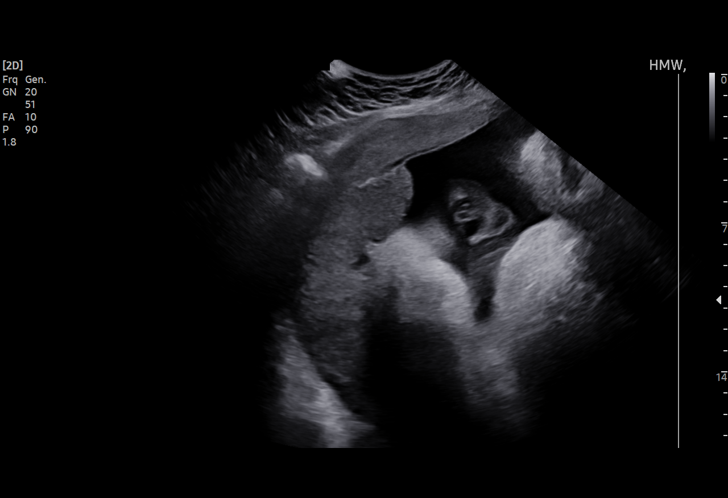
[im 16/48]
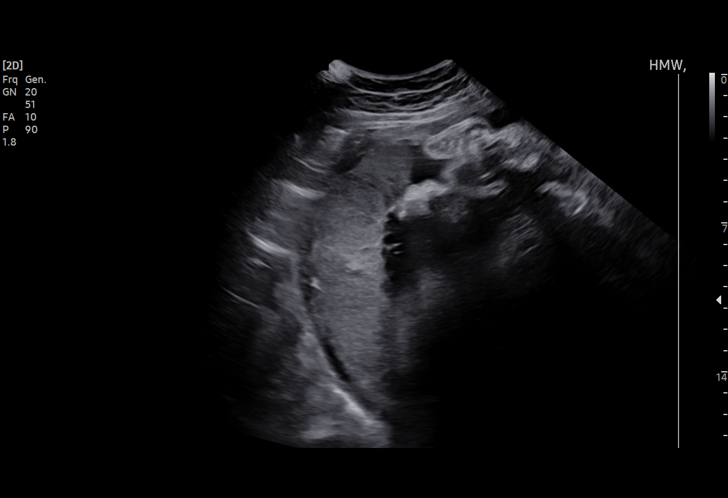
[im 20/48]
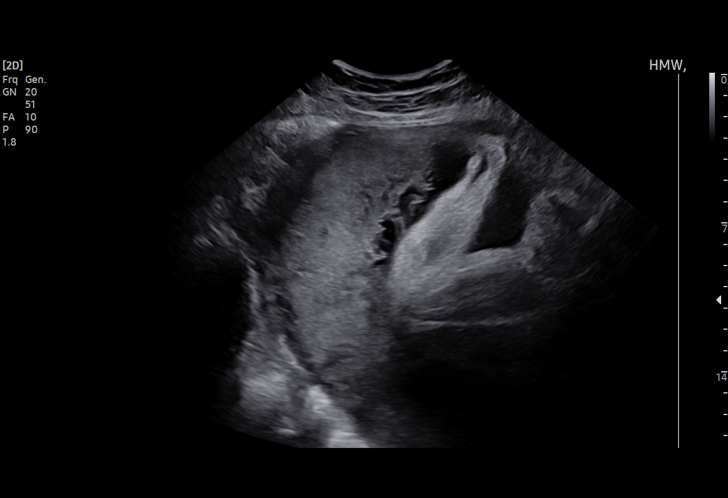
[im 25/48]
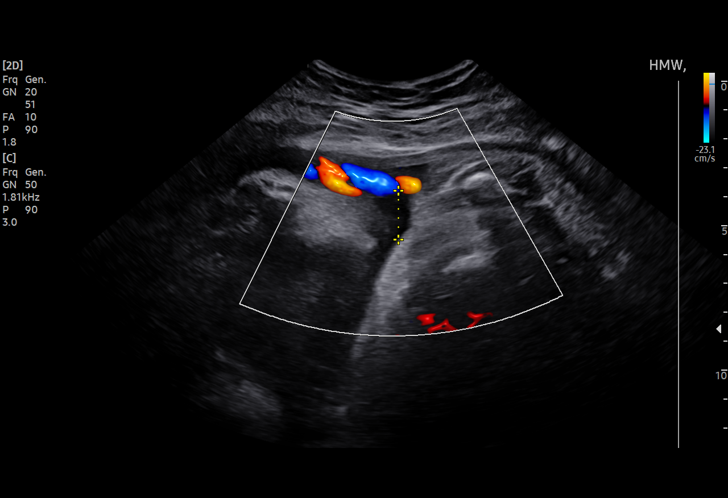
[im 28/48]
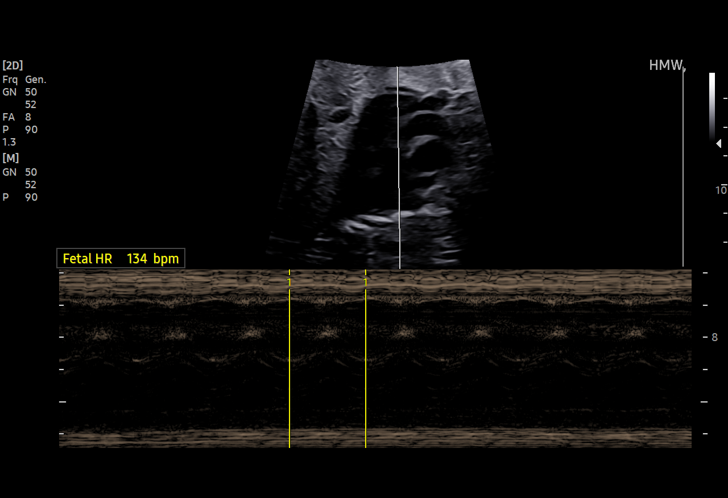
[im 32/48]
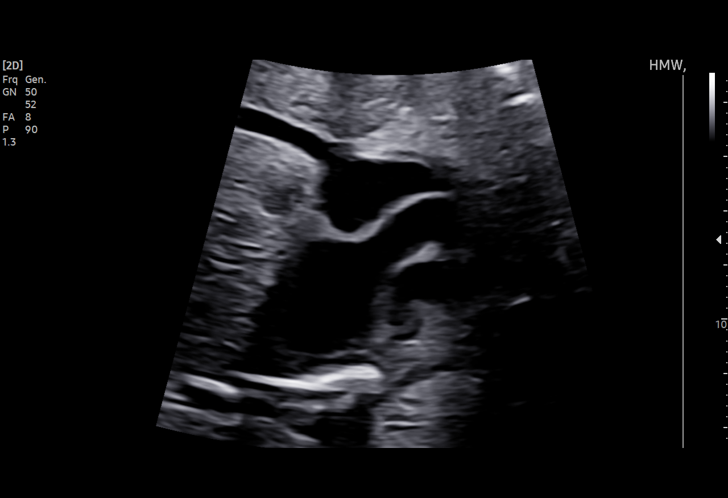
[im 35/48]
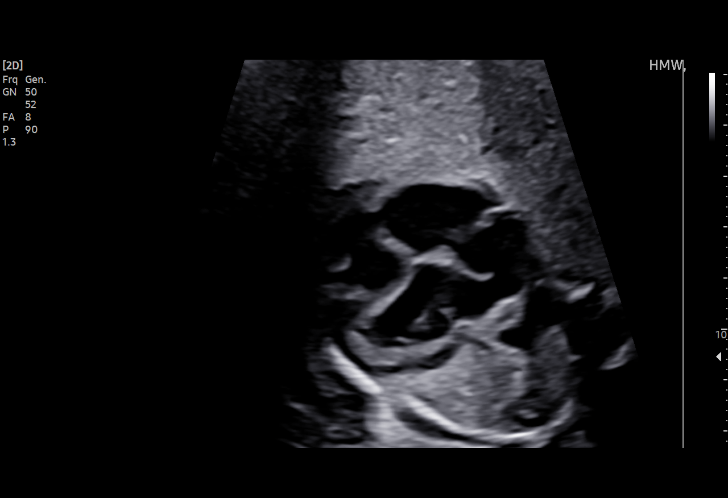
[im 39/48]
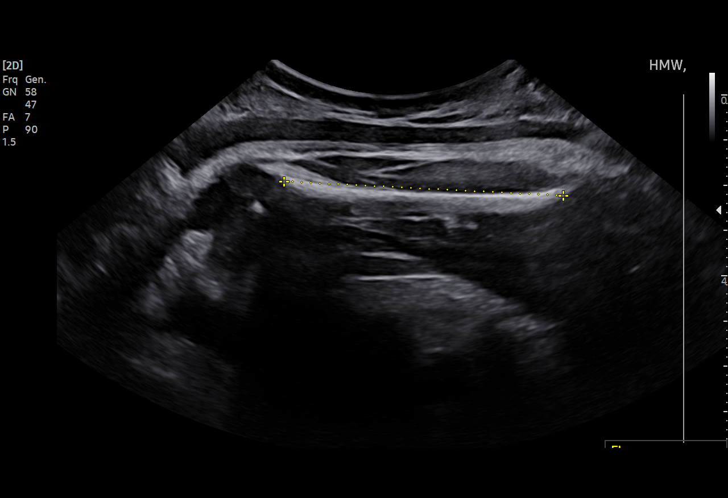
[im 42/48]
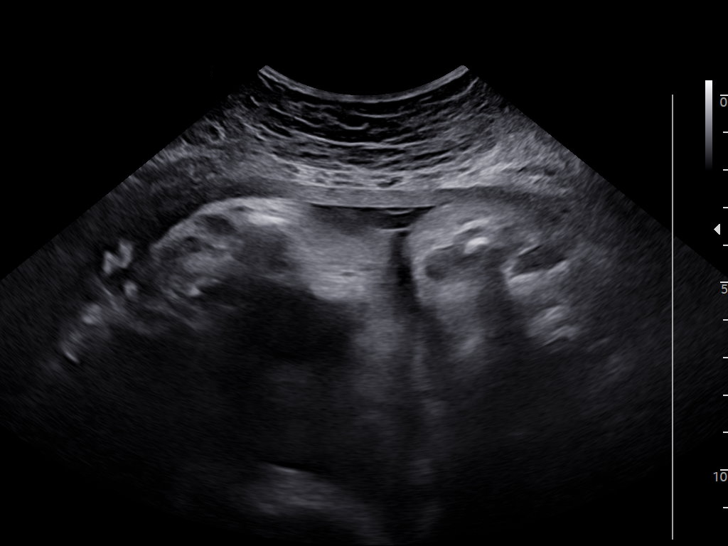
[im 46/48]
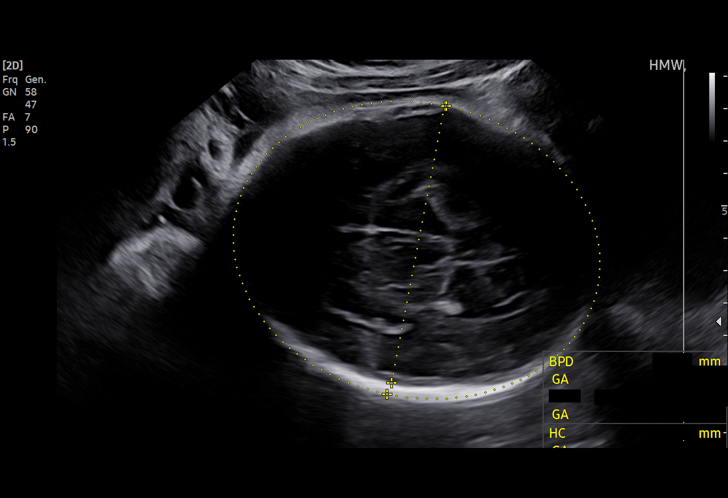

[13 of 28 positions shown; findings below may reference images not displayed]

Indications

 Gestational diabetes in pregnancy, insulin
 controlled
 Fetal choroid plexus cyst (resolved)
 History of cesarean delivery x 3
 36 weeks gestation of pregnancy
 Encounter for antenatal screening,
 unspecified
 Poor obstetric history: Previous gestational
 HTN/cholestasis
 Declined genetic screening
Fetal Evaluation

 Num Of Fetuses:         1
 Fetal Heart Rate(bpm):  134
 Cardiac Activity:       Observed
 Presentation:           Cephalic
 Placenta:               Posterior
 P. Cord Insertion:      Previously Visualized

 Amniotic Fluid
 AFI FV:      Within normal limits

 AFI Sum(cm)     %Tile       Largest Pocket(cm)
 12.59           41

 RUQ(cm)       RLQ(cm)       LUQ(cm)        LLQ(cm)

Biophysical Evaluation

 Amniotic F.V:   Within normal limits       F. Tone:        Observed
 F. Movement:    Observed                   Score:          [DATE]
 F. Breathing:   Observed
Biometry

 BPD:     87.16  mm     G. Age:  35w 1d         32  %    CI:        73.92   %    70 - 86
                                                         FL/HC:      19.8   %    20.1 -
 HC:    321.95   mm     G. Age:  36w 2d         25  %    HC/AC:      0.99        0.93 -
 AC:    324.17   mm     G. Age:  36w 2d         66  %    FL/BPD:     73.3   %    71 - 87
 FL:       63.9  mm     G. Age:  33w 0d        1.1  %    FL/AC:      19.7   %    20 - 24
 HUM:      56.2  mm     G. Age:  32w 5d        < 5  %

 Est. FW:    9804  gm    5 lb 14 oz      30  %
OB History

 Gravidity:    6         Term:   3        Prem:   0        SAB:   2
 TOP:          0       Ectopic:  0        Living: 3
Gestational Age

 LMP:           36w 1d        Date:  12/21/20                  EDD:   09/27/21
 U/S Today:     35w 1d                                        EDD:   10/04/21
 Best:          36w 1d     Det. By:  LMP  (12/21/20)          EDD:   09/27/21
Anatomy

 Cranium:               Appears normal         LVOT:                   Appears normal
 Cavum:                 Previously seen        Aortic Arch:            Previously seen
 Ventricles:            Appears normal         Ductal Arch:            Previously seen
 Choroid Plexus:        Previously seen        Diaphragm:              Appears normal
 Cerebellum:            Previously seen        Stomach:                Appears normal, left
                                                                       sided
 Posterior Fossa:       Previously seen        Abdomen:                Previously seen
 Nuchal Fold:           Not applicable (>20    Abdominal Wall:         Previously seen
                        wks GA)
 Face:                  Orbits and profile     Cord Vessels:           Previously seen
                        previously seen
 Lips:                  Previously seen        Kidneys:                Appear normal
 Palate:                Previously seen        Bladder:                Appears normal
 Thoracic:              Appears normal         Spine:                  Previously seen
 Heart:                 Appears normal         Upper Extremities:      Previously seen
                        (4CH, axis, and
                        situs)
 RVOT:                  Appears normal         Lower Extremities:      Previously seen

 Other:  VC, 3VV and 3VTV visualized previously.  Fetus appears to be
         female. Lenses, nasal bone, maxilla, mandible, heels/feet and open
         hands/5th digits previously visualized previously.
Cervix Uterus Adnexa

 Cervix
 Not visualized (advanced GA >80wks)
 Uterus
 No abnormality visualized.

 Right Ovary
 No adnexal mass visualized.

 Left Ovary
 No adnexal mass visualized.

 Cul De Sac
 No free fluid seen.

 Adnexa
 No abnormality visualized.
Impression

 Follow up growth due to SYLEG on insulin.
 Normal interval growth with measurements consistent with
 dates
 Good fetal movement and amniotic fluid volume
 Biophysical profile [DATE]

 Ms. Yogendra Singh Nafees continue her weekly testing NST/AFI at her
 providers office.
 Delivery scheduled on [DATE].

 If polyhydramnios presents consider delivery.

 I discussed today's findings with Ms. Gonzaled.
